# Patient Record
Sex: Female | Born: 1940 | Race: White | Hispanic: No | Marital: Married | Smoking: Never smoker
Health system: Southern US, Community
[De-identification: ages and names within clinical notes are randomized; demographics above are authoritative.]

## PROBLEM LIST (undated history)

## (undated) DIAGNOSIS — E785 Hyperlipidemia, unspecified: Secondary | ICD-10-CM

## (undated) DIAGNOSIS — I10 Essential (primary) hypertension: Secondary | ICD-10-CM

## (undated) DIAGNOSIS — E039 Hypothyroidism, unspecified: Secondary | ICD-10-CM

## (undated) DIAGNOSIS — K219 Gastro-esophageal reflux disease without esophagitis: Secondary | ICD-10-CM

## (undated) DIAGNOSIS — M519 Unspecified thoracic, thoracolumbar and lumbosacral intervertebral disc disorder: Secondary | ICD-10-CM

## (undated) DIAGNOSIS — C801 Malignant (primary) neoplasm, unspecified: Secondary | ICD-10-CM

## (undated) DIAGNOSIS — I341 Nonrheumatic mitral (valve) prolapse: Secondary | ICD-10-CM

## (undated) DIAGNOSIS — Z923 Personal history of irradiation: Secondary | ICD-10-CM

## (undated) HISTORY — PX: ABDOMINAL HYSTERECTOMY: SHX81

## (undated) HISTORY — PX: THYROID SURGERY: SHX805

## (undated) HISTORY — PX: STOMACH SURGERY: SHX791

---
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
## 2004-02-04 ENCOUNTER — Ambulatory Visit: Payer: Self-pay | Admitting: Unknown Physician Specialty

## 2004-02-04 ENCOUNTER — Ambulatory Visit: Payer: Self-pay | Admitting: Internal Medicine

## 2004-05-16 ENCOUNTER — Ambulatory Visit: Payer: Self-pay | Admitting: Unknown Physician Specialty

## 2004-09-06 ENCOUNTER — Ambulatory Visit: Payer: Self-pay | Admitting: Internal Medicine

## 2004-09-26 ENCOUNTER — Ambulatory Visit: Payer: Self-pay | Admitting: Internal Medicine

## 2005-03-09 ENCOUNTER — Ambulatory Visit: Payer: Self-pay | Admitting: Internal Medicine

## 2005-06-15 ENCOUNTER — Ambulatory Visit: Payer: Self-pay | Admitting: Internal Medicine

## 2005-06-15 IMAGING — NM NUCLEAR MEDICINE HEPATOHBILIARY INCLUDE GB
1 series · 6 of 6 positions shown · non-contrast
Comparison: none

REASON FOR EXAM: RUQ pain
COMMENTS:

[Series 0: cck hepato · 3.9mm · 3.90mm/px · 6 of 60 frames shown]
[frame 6/60]
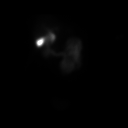
[frame 16/60]
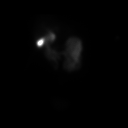
[frame 26/60]
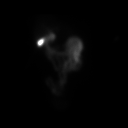
[frame 36/60]
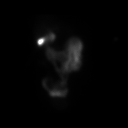
[frame 46/60]
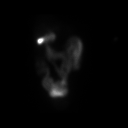
[frame 56/60]
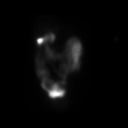

[6 of 6 positions shown; findings below may reference images not displayed]

PROCEDURE:     NM  - NM HEPATO WITH GB EJECT FRACTION  - [DATE] [DATE]

RESULT:     Following injection of 8.47 mCi of Technetium 99m Choletec,
there is noted prompt visualization of tracer activity in the liver at 3
minutes. At 85 minutes, tracer activity is visualized in the gallbladder,
common duct and proximal small bowel.

the normal range.
IMPRESSION: 1.     Normal Hepatobiliary Scan.
2.     Normal gallbladder ejection fraction.

## 2006-03-05 ENCOUNTER — Ambulatory Visit: Payer: Self-pay | Admitting: Internal Medicine

## 2006-04-16 ENCOUNTER — Other Ambulatory Visit: Payer: Self-pay

## 2006-04-16 ENCOUNTER — Inpatient Hospital Stay: Payer: Self-pay | Admitting: Internal Medicine

## 2006-04-24 ENCOUNTER — Ambulatory Visit: Payer: Self-pay | Admitting: Internal Medicine

## 2007-01-30 ENCOUNTER — Ambulatory Visit: Payer: Self-pay | Admitting: Internal Medicine

## 2007-03-22 ENCOUNTER — Ambulatory Visit: Payer: Self-pay | Admitting: Internal Medicine

## 2008-01-22 ENCOUNTER — Emergency Department: Payer: Self-pay | Admitting: Internal Medicine

## 2008-03-23 ENCOUNTER — Ambulatory Visit: Payer: Self-pay | Admitting: Internal Medicine

## 2008-07-01 ENCOUNTER — Ambulatory Visit: Payer: Self-pay | Admitting: Unknown Physician Specialty

## 2008-11-05 ENCOUNTER — Ambulatory Visit: Payer: Self-pay | Admitting: Internal Medicine

## 2009-01-11 ENCOUNTER — Ambulatory Visit: Payer: Self-pay | Admitting: Unknown Physician Specialty

## 2009-01-15 ENCOUNTER — Ambulatory Visit: Payer: Self-pay | Admitting: Unknown Physician Specialty

## 2009-05-27 ENCOUNTER — Ambulatory Visit: Payer: Self-pay | Admitting: Internal Medicine

## 2009-06-08 ENCOUNTER — Ambulatory Visit: Payer: Self-pay | Admitting: Pain Medicine

## 2009-07-08 ENCOUNTER — Ambulatory Visit: Payer: Self-pay | Admitting: Pain Medicine

## 2009-07-15 ENCOUNTER — Ambulatory Visit: Payer: Self-pay | Admitting: Pain Medicine

## 2009-07-20 ENCOUNTER — Emergency Department: Payer: Self-pay | Admitting: Emergency Medicine

## 2009-07-29 ENCOUNTER — Ambulatory Visit: Payer: Self-pay | Admitting: Pain Medicine

## 2010-04-22 ENCOUNTER — Ambulatory Visit: Payer: Self-pay | Admitting: Unknown Physician Specialty

## 2010-04-26 LAB — PATHOLOGY REPORT

## 2010-05-30 ENCOUNTER — Ambulatory Visit: Payer: Self-pay | Admitting: Internal Medicine

## 2010-09-27 ENCOUNTER — Ambulatory Visit: Payer: Self-pay | Admitting: Neurosurgery

## 2011-02-27 ENCOUNTER — Ambulatory Visit: Payer: Self-pay | Admitting: Neurosurgery

## 2011-06-19 ENCOUNTER — Ambulatory Visit: Payer: Self-pay | Admitting: Internal Medicine

## 2012-06-19 ENCOUNTER — Ambulatory Visit: Payer: Self-pay | Admitting: Internal Medicine

## 2013-03-25 ENCOUNTER — Ambulatory Visit: Payer: Self-pay | Admitting: Ophthalmology

## 2013-04-02 ENCOUNTER — Ambulatory Visit: Payer: Self-pay | Admitting: Ophthalmology

## 2013-06-02 ENCOUNTER — Ambulatory Visit: Payer: Self-pay | Admitting: Unknown Physician Specialty

## 2013-06-04 LAB — PATHOLOGY REPORT

## 2013-06-13 DIAGNOSIS — E785 Hyperlipidemia, unspecified: Secondary | ICD-10-CM | POA: Insufficient documentation

## 2013-06-13 DIAGNOSIS — E782 Mixed hyperlipidemia: Secondary | ICD-10-CM | POA: Insufficient documentation

## 2013-06-13 DIAGNOSIS — I10 Essential (primary) hypertension: Secondary | ICD-10-CM | POA: Insufficient documentation

## 2013-06-20 ENCOUNTER — Ambulatory Visit: Payer: Self-pay | Admitting: Internal Medicine

## 2013-10-01 ENCOUNTER — Ambulatory Visit: Payer: Self-pay | Admitting: Ophthalmology

## 2013-10-14 ENCOUNTER — Ambulatory Visit: Payer: Self-pay | Admitting: Ophthalmology

## 2013-11-18 ENCOUNTER — Ambulatory Visit: Payer: Self-pay | Admitting: Ophthalmology

## 2014-06-16 ENCOUNTER — Ambulatory Visit
Admit: 2014-06-16 | Disposition: A | Discharge status: Home / Self Care | Payer: Self-pay | Attending: Surgery | Admitting: Surgery

## 2014-06-16 LAB — COMPREHENSIVE METABOLIC PANEL
ALBUMIN: 3.9 g/dL
ANION GAP: 3 — AB (ref 7–16)
Alkaline Phosphatase: 56 U/L
BUN: 18 mg/dL
Bilirubin,Total: 0.6 mg/dL
CO2: 31 mmol/L
Calcium, Total: 9.5 mg/dL
Chloride: 103 mmol/L
Creatinine: 0.82 mg/dL
EGFR (Non-African Amer.): >60
GLUCOSE: 98 mg/dL
Potassium: 4.4 mmol/L
SGOT(AST): 21 U/L
SGPT (ALT): 11 U/L — ABNORMAL LOW
SODIUM: 137 mmol/L
Total Protein: 6.4 g/dL — ABNORMAL LOW

## 2014-06-16 LAB — CBC WITH DIFFERENTIAL/PLATELET
Basophil #: 0 10*3/uL (ref 0.0–0.1)
Basophil %: 0.6 %
Eosinophil #: 0.1 10*3/uL (ref 0.0–0.7)
Eosinophil %: 1.5 %
HCT: 40.2 % (ref 35.0–47.0)
HGB: 13.4 g/dL (ref 12.0–16.0)
LYMPHS PCT: 32 %
Lymphocyte #: 1.6 10*3/uL (ref 1.0–3.6)
MCH: 31.2 pg (ref 26.0–34.0)
MCHC: 33.4 g/dL (ref 32.0–36.0)
MCV: 93 fL (ref 80–100)
MONOS PCT: 9 %
Monocyte #: 0.4 x10 3/mm (ref 0.2–0.9)
Neutrophil #: 2.8 10*3/uL (ref 1.4–6.5)
Neutrophil %: 56.9 %
PLATELETS: 241 10*3/uL (ref 150–440)
RBC: 4.3 10*6/uL (ref 3.80–5.20)
RDW: 13.2 % (ref 11.5–14.5)
WBC: 5 10*3/uL (ref 3.6–11.0)

## 2014-06-19 ENCOUNTER — Ambulatory Visit
Admit: 2014-06-19 | Disposition: A | Discharge status: Home / Self Care | Payer: Self-pay | Attending: Surgery | Admitting: Surgery

## 2014-06-20 NOTE — Op Note (Signed)
PATIENT NAME:  Chelsea Peters, Chelsea Peters MR#:  [id number] DATE OF BIRTH:  [birth year]  ACCOUNT NUMBER: [id number]  DATE OF PROCEDURE:  04/02/2013  PROCEDURE PERFORMED:  1. Pars plana vitrectomy of the left eye.  2. Internal limiting membrane peel of the left eye.   PREOPERATIVE DIAGNOSES:  1. Epiretinal membrane.  2. Retinal edema.   POSTOPERATIVE DIAGNOSES:  1. Epiretinal membrane.  2. Retinal edema.   ESTIMATED BLOOD LOSS: Less than 1 mL.  PRIMARY SURGEON: Teresa Pelton. Tacy Chavis, MD  ANESTHESIA: Retrobulbar block of the left eye with monitored anesthesia care.   COMPLICATIONS: None.   INDICATIONS FOR PROCEDURE: The patient presented to my office with slowly decreasing vision in the left eye and visually significant metamorphopsia interfering with driving and reading. Risks, benefits and alternatives of the above procedure were discussed, and the patient wished to proceed.   DETAILS: After informed consent was obtained, the patient was brought into the operative suite at [facility]. The patient was placed in supine position, was given a small dose of Alfenta, and a retrobulbar block was performed on the left eye by the primary surgeon without any complications. The left eye was prepped and draped in sterile manner. After lid speculum was inserted, a 25-gauge trocar was placed inferotemporally through displaced conjunctiva 4 mm beyond the limbus in an oblique fashion. Infusion cannula was turned on and inserted through the trocar and secured into position with Steri-Strips. Two more trocars were placed in a similar fashion superotemporally and superonasally. The vitreous cutter and light pipe were introduced in the eye, and a core vitrectomy was performed. Vitreous face was confirmed as elevated and removed. The peripheral vitreous was trimmed for 360 degrees with care taken to avoid hitting the crystalline lens. Indocyanine green was injected onto the posterior pole and removed  within 30 seconds. The epiretinal membrane followed by internal limiting membrane peel were performed for 360 degrees around the fovea without any complication. A scleral depressed exam was performed for 360 degrees, and no signs of any breaks, tears or retinal detachment could be identified for 360 degrees. A partial air-fluid exchange was performed, and the trocars were removed. The wounds were noted to be airtight, and the pressure in the eye was confirmed to be approximately 15 mmHg. Dexamethasone 5 mg was given into the inferior fornix, and the lid speculum was removed. The eye was cleaned, and TobraDex was placed in the eye. A patch and shield were placed over the eye, and the patient was taken to postanesthesia care with instructions to remain head up.    ____________________________ Teresa Pelton. Starling Manns, MD mfa:lb D: 04/02/2013 09:11:24 ET T: 04/02/2013 09:23:33 ET JOB#: [id number]  cc: Teresa Pelton. Starling Manns, MD, <Dictator> Coralee Rud MD ELECTRONICALLY SIGNED 04/02/2013 11:13

## 2014-06-20 NOTE — Op Note (Signed)
PATIENT NAME:  Chelsea Peters, Chelsea Peters MR#:  [id number] DATE OF BIRTH:  1940-09-21  DATE OF PROCEDURE:  11/18/2013  PREOPERATIVE DIAGNOSIS: Visually significant cataract of the right eye.   POSTOPERATIVE DIAGNOSIS: Visually significant cataract of the right eye.   OPERATIVE PROCEDURE: Cataract extraction by phacoemulsification with implant of intraocular lens to right eye.   SURGEON: Birder Robson, MD  ANESTHESIA:  1. Managed anesthesia care.  2. Topical tetracaine drops followed by 2% Xylocaine jelly applied in the preoperative holding area.   COMPLICATIONS: None.   TECHNIQUE:  Stop and chop.   DESCRIPTION OF PROCEDURE: The patient was examined and consented in the preoperative holding area where the aforementioned topical anesthesia was applied to the right eye and then brought back to the operating room where the right eye was prepped and draped in the usual sterile ophthalmic fashion and a lid speculum was placed. A paracentesis was created with the side port blade and the anterior chamber was filled with viscoelastic. A near clear corneal incision was performed with the steel keratome. A continuous curvilinear capsulorrhexis was performed with a cystotome followed by the capsulorrhexis forceps. Hydrodissection and hydrodelineation were carried out with BSS on a blunt cannula. The lens was removed in a stop-and-chop technique and the remaining cortical material was removed with the irrigation-aspiration handpiece. The capsular bag was inflated with viscoelastic and the Tecnis ZCB00 2.5-diopter lens, serial number [id number] was placed in the capsular bag without complication. The remaining viscoelastic was removed from the eye with the irrigation-aspiration handpiece. The wounds were hydrated. The anterior chamber was flushed with Miostat and the eye was inflated to physiologic pressure; 0.1 mL of cefuroxime concentration 10 mg/mL was placed in the anterior chamber. The wounds were found to be  water tight. The eye was dressed with Vigamox. The patient was given protective glasses to wear throughout the day and a shield with which to sleep tonight. The patient was also given drops with which to begin a drop regimen today and will follow up with me in one day.    ____________________________ Livingston Diones. Jaquasha Carnevale, MD wlp:ST D: 11/18/2013 21:07:53 ET T: 11/18/2013 21:45:16 ET JOB#: [id number]  cc: Allon Costlow L. Chauncey Bruno, MD, <Dictator> Livingston Diones Radford Pease MD ELECTRONICALLY SIGNED 11/19/2013 10:43

## 2014-06-20 NOTE — Op Note (Signed)
PATIENT NAME:  Chelsea Peters, BARRE MR#:  [id number] DATE OF BIRTH:  Aug 23, 1940  DATE OF PROCEDURE:  10/14/2013  PREOPERATIVE DIAGNOSIS: Visually significant cataract of the left eye.   POSTOPERATIVE DIAGNOSIS: Visually significant cataract of the left eye.   OPERATIVE PROCEDURE: Cataract extraction by phacoemulsification with implant of intraocular lens to left eye.   SURGEON: Birder Robson, MD.   ANESTHESIA:  1. Managed anesthesia care.  2. Topical tetracaine drops followed by 2% Xylocaine jelly applied in the preoperative holding area.   COMPLICATIONS: None.   TECHNIQUE:  Stop and chop.  DESCRIPTION OF PROCEDURE: The patient was examined and consented in the preoperative holding area where the aforementioned topical anesthesia was applied to the left eye and then brought back to the Operating Room where the left eye was prepped and draped in the usual sterile ophthalmic fashion and a lid speculum was placed. A paracentesis was created with the side port blade and the anterior chamber was filled with viscoelastic. A near clear corneal incision was performed with the steel keratome. A continuous curvilinear capsulorrhexis was performed with a cystotome followed by the capsulorrhexis forceps. Hydrodissection and hydrodelineation were carried out with BSS on a blunt cannula. The lens was removed in a stop-and-chop technique and the remaining cortical material was removed with the irrigation-aspiration handpiece. The capsular bag was inflated with viscoelastic and the Tecnis ZCB00 21.5-diopter lens, serial number [id number] was placed in the capsular bag without complication. The remaining viscoelastic was removed from the eye with the irrigation-aspiration handpiece. The wounds were hydrated. The anterior chamber was flushed with Miostat and the eye was inflated to physiologic pressure. 0.1 mL of cefuroxime concentration 10 mg/mL was placed in the anterior chamber. The wounds were found to be water  tight. The eye was dressed with Vigamox. The patient was given protective glasses to wear throughout the day and a shield with which to sleep tonight. The patient was also given drops with which to begin a drop regimen today and will follow-up with me in one day.    ____________________________ Livingston Diones. Andree Golphin, MD wlp:TT D: 10/14/2013 20:33:52 ET T: 10/14/2013 21:47:21 ET JOB#: [id number]  cc: Taeya Theall L. Jamiesha Victoria, MD, <Dictator> Livingston Diones Emi Lymon MD ELECTRONICALLY SIGNED 10/15/2013 8:51

## 2014-06-22 ENCOUNTER — Ambulatory Visit: Admit: 2014-06-22 | Disposition: A | Discharge status: Home / Self Care | Payer: Self-pay | Admitting: Internal Medicine

## 2014-06-22 LAB — SURGICAL PATHOLOGY

## 2014-06-23 ENCOUNTER — Other Ambulatory Visit: Payer: Self-pay | Admitting: Internal Medicine

## 2014-06-23 DIAGNOSIS — Z1231 Encounter for screening mammogram for malignant neoplasm of breast: Secondary | ICD-10-CM

## 2014-06-28 NOTE — Op Note (Signed)
PATIENT NAME:  Chelsea Peters, Chelsea Peters MR#:  [id number] DATE OF BIRTH:  March 04, 1940  DATE OF PROCEDURE:  06/19/2014  PREOPERATIVE DIAGNOSIS: Left thyroid nodule.   POSTOPERATIVE DIAGNOSIS:  Left thyroid nodule.  PROCEDURE: Left thyroid lobectomy.   SURGEON:  Loreli Dollar, M.D.   ANESTHESIA: General.   INDICATIONS: This 74 year old female has history of left thyroid nodule gradually increasing in size with moderate discomfort. Ultrasound demonstrated a 3.4 x 2 x 2.7 cm nodule. Fine needle aspiration demonstrated hyperplastic adenomatoid nodule. Surgery was recommended for definitive treatment.   DESCRIPTION OF PROCEDURE: The patient was placed on the operating table in the supine position under general endotracheal anesthesia.  The rolled towel was placed behind the shoulder blades to extend the neck as the head was placed on a doughnut ring. The neck and upper chest wall were prepared with ChloraPrep and draped in a sterile manner.   A transversely oriented 6 cm collar incision was made, carried down through a thin layer of subcutaneous tissues, and through the platysma.  Electrocautery was used for hemostasis. There were 3 veins which were suture ligated with 4-0 chromic and divided.  Two of the veins were approximately 5 mm in dimension and the other vein was proximally 7 mm in dimension. The sternocleidomastoid muscle was separated from the strap muscles on each side and the strap muscles were divided with Harmonic scalpel. Next, a smooth plane of dissection was found adjacent to the thyroid capsule on the left side and blunt and sharp dissection was carried out.  The left lower lobe was mobilized with traction and further blunt and sharp dissection. The superior pole was dissected.  The inferior thyroidal artery was adjacent to the capsule.  It was divided with a Harmonic scalpel. One parathyroid gland was identified somewhat superior to the inferior thyroidal artery.  The superior pole was doubly  ligated with 3-0 Vicryl and divided with the Harmonic scalpel. Further dissection was carried out as the thyroid was further mobilized.  It did not appear that the inferior thyroidal nerve was attached to the gland.  The gland was further mobilized and the isthmus was divided with Harmonic scalpel. The nodule was further palpated and noted to be smooth and completely resected and the upper pole was tagged with a stitch for the pathologist's orientation and was submitted in formalin for routine pathology. The wound was inspected and it appeared that hemostasis was intact. Next, the strap muscles were approximated with interrupted 4-0 Vicryl figure-of-eight sutures. The platysma was closed with interrupted 5-0 Vicryl sutures and the skin was infiltrated with 0.5% Sensorcaine with epinephrine.  Then the skin was closed with running 5-0 Monocryl subcuticular suture and LiquiBand.   The patient appeared to tolerate the procedure satisfactorily and was then prepared for transfer to the recovery room.   ____________________________ Lenna Sciara. Rochel Brome, MD jws:sp D: 06/19/2014 10:20:11 ET T: 06/19/2014 10:56:39 ET JOB#: [id number]  cc: Loreli Dollar, MD, <Dictator> Loreli Dollar MD ELECTRONICALLY SIGNED 06/24/2014 17:32

## 2014-07-02 ENCOUNTER — Ambulatory Visit
Admission: RE | Admit: 2014-07-02 | Discharge: 2014-07-02 | Disposition: A | Discharge status: Home / Self Care | Payer: Medicare Other | Source: Ambulatory Visit | Attending: Internal Medicine | Admitting: Internal Medicine

## 2014-07-02 DIAGNOSIS — Z1231 Encounter for screening mammogram for malignant neoplasm of breast: Secondary | ICD-10-CM | POA: Insufficient documentation

## 2015-05-19 ENCOUNTER — Emergency Department: Payer: Medicare Other

## 2015-05-19 ENCOUNTER — Emergency Department
Admission: EM | Admit: 2015-05-19 | Discharge: 2015-05-19 | Disposition: A | Discharge status: Home / Self Care | Payer: Medicare Other | Attending: Emergency Medicine | Admitting: Emergency Medicine

## 2015-05-19 DIAGNOSIS — R531 Weakness: Secondary | ICD-10-CM | POA: Insufficient documentation

## 2015-05-19 DIAGNOSIS — M79602 Pain in left arm: Secondary | ICD-10-CM | POA: Diagnosis not present

## 2015-05-19 DIAGNOSIS — R55 Syncope and collapse: Secondary | ICD-10-CM | POA: Diagnosis present

## 2015-05-19 DIAGNOSIS — R42 Dizziness and giddiness: Secondary | ICD-10-CM | POA: Diagnosis not present

## 2015-05-19 DIAGNOSIS — R079 Chest pain, unspecified: Secondary | ICD-10-CM | POA: Diagnosis not present

## 2015-05-19 DIAGNOSIS — R61 Generalized hyperhidrosis: Secondary | ICD-10-CM | POA: Insufficient documentation

## 2015-05-19 LAB — CBC WITH DIFFERENTIAL/PLATELET
Basophils Absolute: 0 10*3/uL (ref 0–0.1)
Basophils Relative: 1 %
Eosinophils Absolute: 0.2 10*3/uL (ref 0–0.7)
Eosinophils Relative: 3 %
HEMATOCRIT: 42.5 % (ref 35.0–47.0)
HEMOGLOBIN: 14.4 g/dL (ref 12.0–16.0)
LYMPHS ABS: 2.5 10*3/uL (ref 1.0–3.6)
LYMPHS PCT: 41 %
MCH: 31.8 pg (ref 26.0–34.0)
MCHC: 34 g/dL (ref 32.0–36.0)
MCV: 93.7 fL (ref 80.0–100.0)
MONO ABS: 0.5 10*3/uL (ref 0.2–0.9)
MONOS PCT: 9 %
NEUTROS ABS: 2.9 10*3/uL (ref 1.4–6.5)
NEUTROS PCT: 46 %
Platelets: 264 10*3/uL (ref 150–440)
RBC: 4.53 MIL/uL (ref 3.80–5.20)
RDW: 13.5 % (ref 11.5–14.5)
WBC: 6.1 10*3/uL (ref 3.6–11.0)

## 2015-05-19 LAB — URINALYSIS COMPLETE WITH MICROSCOPIC (ARMC ONLY)
Bilirubin Urine: NEGATIVE
Glucose, UA: NEGATIVE mg/dL
Hgb urine dipstick: NEGATIVE
Ketones, ur: NEGATIVE mg/dL
LEUKOCYTES UA: NEGATIVE
Nitrite: NEGATIVE
PH: 8 (ref 5.0–8.0)
PROTEIN: NEGATIVE mg/dL
RBC / HPF: NONE SEEN RBC/hpf (ref 0–5)
Specific Gravity, Urine: 1.002 — ABNORMAL LOW (ref 1.005–1.030)
WBC, UA: NONE SEEN WBC/hpf (ref 0–5)

## 2015-05-19 LAB — COMPREHENSIVE METABOLIC PANEL
ALBUMIN: 4.1 g/dL (ref 3.5–5.0)
ALK PHOS: 61 U/L (ref 38–126)
ALT: 17 U/L (ref 14–54)
ANION GAP: 6 (ref 5–15)
AST: 31 U/L (ref 15–41)
BILIRUBIN TOTAL: 0.6 mg/dL (ref 0.3–1.2)
BUN: 16 mg/dL (ref 6–20)
CO2: 27 mmol/L (ref 22–32)
CREATININE: 0.81 mg/dL (ref 0.44–1.00)
Calcium: 9.1 mg/dL (ref 8.9–10.3)
Chloride: 104 mmol/L (ref 101–111)
GFR calc Af Amer: >60 mL/min (ref >60)
GLUCOSE: 109 mg/dL — AB (ref 65–99)
POTASSIUM: 3.1 mmol/L — AB (ref 3.5–5.1)
Sodium: 137 mmol/L (ref 135–145)
Total Protein: 7.1 g/dL (ref 6.5–8.1)

## 2015-05-19 LAB — TROPONIN I: Troponin I: <0.03 ng/mL (ref <0.031)

## 2015-05-19 MED ORDER — SODIUM CHLORIDE 0.9 % IV SOLN
1000.0000 mL | Freq: Once | INTRAVENOUS | Status: AC
  Administered 2015-05-19: 1000 mL via INTRAVENOUS

## 2015-05-19 MED ORDER — LEVOFLOXACIN IN D5W 750 MG/150ML IV SOLN
INTRAVENOUS | Status: AC
  Filled 2015-05-19: qty 150

## 2015-05-19 MED ORDER — LORAZEPAM 2 MG/ML IJ SOLN
0.5000 mg | Freq: Once | INTRAMUSCULAR | Status: AC
  Administered 2015-05-19: 0.5 mg via INTRAVENOUS
  Filled 2015-05-19: qty 1

## 2015-05-19 NOTE — ED Provider Notes (Signed)
[facility] Emergency Department Provider Note     Time seen: ----------------------------------------- 9:37 AM on 05/19/2015 -----------------------------------------    I have reviewed the triage vital signs and the nursing notes.   HISTORY  Chief Complaint Arm Pain; Chest Pain; and Near Syncope    HPI Chelsea Peters is a 75 y.o. female who presents to ER for near syncopal feeling that started last night. Patient also is having some pain in her left arm, this morning around 7:30 she was feeling more lightheaded and felt diaphoretic. She does report that some pressure in the center of her chest with radiation to left arm, denies any recent illness. She does report recent stressors at home. She denies history of symptoms in the past.   No past medical history on file.  There are no active problems to display for this patient.   No past surgical history on file.  Allergies Review of patient's allergies indicates not on file.  Social History Social History  Substance Use Topics  . Smoking status: Not on file  . Smokeless tobacco: Not on file  . Alcohol Use: Not on file    Review of Systems Constitutional: Negative for fever. Eyes: Negative for visual changes. ENT: Negative for sore throat. Cardiovascular: Positive for chest pain Respiratory: Negative for shortness of breath. Gastrointestinal: Negative for abdominal pain, vomiting and diarrhea. Genitourinary: Negative for dysuria. Musculoskeletal: Negative for back pain. Skin: Positive for sweats Neurological: Negative for headaches, positive for weakness and lightheadedness  10-point ROS otherwise negative.  ____________________________________________   PHYSICAL EXAM:  VITAL SIGNS: ED Triage Vitals  Enc Vitals Group     BP --      Pulse --      Resp --      Temp --      Temp src --      SpO2 --      Weight --      Height --      Head Cir --      Peak Flow --      Pain  Score --      Pain Loc --      Pain Edu? --      Excl. in [place]? --    Constitutional: Alert and oriented. Well appearing and in no distress.Mildly anxious Eyes: Conjunctivae are normal. PERRL. Normal extraocular movements. ENT   Head: Normocephalic and atraumatic.   Nose: No congestion/rhinnorhea.   Mouth/Throat: Mucous membranes are moist.   Neck: No stridor. Cardiovascular: Normal rate, regular rhythm. Normal and symmetric distal pulses are present in all extremities. No murmurs, rubs, or gallops. Respiratory: Normal respiratory effort without tachypnea nor retractions. Breath sounds are clear and equal bilaterally. No wheezes/rales/rhonchi. Gastrointestinal: Soft and nontender. No distention. No abdominal bruits.  Musculoskeletal: Nontender with normal range of motion in all extremities. No joint effusions.  No lower extremity tenderness nor edema. Neurologic:  Normal speech and language. No gross focal neurologic deficits are appreciated.  Skin:  Skin is warm, dry and intact. No rash noted. Psychiatric: Mood and affect are normal.  ____________________________________________  EKG: Interpreted by me. Normal sinus rhythm with a rate of 79 bpm, normal PR interval, normal QRS, normal QT interval. Nonspecific ST changes  ____________________________________________  ED COURSE:  Pertinent labs & imaging results that were available during my care of the patient were reviewed by me and considered in my medical decision making (see chart for details). Patient's no distress, will evaluate for weakness and near-syncope. ____________________________________________  LABS (pertinent positives/negatives)  Labs Reviewed  COMPREHENSIVE METABOLIC PANEL - Abnormal; Notable for the following:    Potassium 3.1 (*)    Glucose, Bld 109 (*)    All other components within normal limits  URINALYSIS COMPLETEWITH MICROSCOPIC ([facility] ONLY) - Abnormal; Notable for the following:    Color,  Urine STRAW (*)    APPearance CLEAR (*)    Specific Gravity, Urine 1.002 (*)    Bacteria, UA RARE (*)    Squamous Epithelial / LPF 0-5 (*)    All other components within normal limits  CBC WITH DIFFERENTIAL/PLATELET  TROPONIN I  TROPONIN I    RADIOLOGY  Chest x-ray Unremarkable ____________________________________________  FINAL ASSESSMENT AND PLAN  Near-syncope  Plan: Patient with labs and imaging as dictated above. Patient with nonspecific symptoms that are likely stress related. Serial troponins have been negative, I advise follow-up with cardiology for stress testing as an outpatient.   Earleen Newport, MD   Earleen Newport, MD 05/19/15 [phone]

## 2015-05-19 NOTE — ED Notes (Signed)
Dr. Williams in room to reassess patient.  Will continue to monitor.   

## 2015-05-19 NOTE — ED Notes (Signed)
Pt reports she began last night having some pain in her left arm, this am around 0730 pt noticed she was having some light headedness and felt diaphoretic. Pt reports there is pressure in the center of her chest with radiation into left arm.

## 2015-05-19 NOTE — ED Notes (Signed)
Orthostatic Vital Signs  Lying:  HR-68, BP-139/62 Sitting:  HR-74, BP-145/73 Standing:  HR-72, BP-136/78

## 2015-05-19 NOTE — Discharge Instructions (Signed)

## 2015-05-20 ENCOUNTER — Telehealth: Payer: Self-pay | Admitting: Cardiology

## 2015-05-20 NOTE — Telephone Encounter (Signed)
Lmov for patient to call back to make ED fu from 05/19/15  Was seen in ED for Chest Pain

## 2015-05-26 ENCOUNTER — Other Ambulatory Visit: Payer: Self-pay | Admitting: Internal Medicine

## 2015-05-26 DIAGNOSIS — Z1231 Encounter for screening mammogram for malignant neoplasm of breast: Secondary | ICD-10-CM

## 2015-05-28 ENCOUNTER — Other Ambulatory Visit: Payer: Self-pay | Admitting: Cardiology

## 2015-06-02 ENCOUNTER — Ambulatory Visit
Admission: RE | Admit: 2015-06-02 | Discharge: 2015-06-02 | Disposition: A | Discharge status: Home / Self Care | Payer: Medicare Other | Source: Ambulatory Visit | Attending: Cardiology | Admitting: Cardiology

## 2015-06-02 ENCOUNTER — Encounter
Admission: RE | Disposition: A | Discharge status: Home / Self Care | Payer: Self-pay | Source: Ambulatory Visit | Attending: Cardiology

## 2015-06-02 DIAGNOSIS — Z803 Family history of malignant neoplasm of breast: Secondary | ICD-10-CM | POA: Insufficient documentation

## 2015-06-02 DIAGNOSIS — K219 Gastro-esophageal reflux disease without esophagitis: Secondary | ICD-10-CM | POA: Diagnosis not present

## 2015-06-02 DIAGNOSIS — E890 Postprocedural hypothyroidism: Secondary | ICD-10-CM | POA: Diagnosis not present

## 2015-06-02 DIAGNOSIS — I10 Essential (primary) hypertension: Secondary | ICD-10-CM | POA: Insufficient documentation

## 2015-06-02 DIAGNOSIS — Z9049 Acquired absence of other specified parts of digestive tract: Secondary | ICD-10-CM | POA: Insufficient documentation

## 2015-06-02 DIAGNOSIS — Z888 Allergy status to other drugs, medicaments and biological substances status: Secondary | ICD-10-CM | POA: Diagnosis not present

## 2015-06-02 DIAGNOSIS — Z90710 Acquired absence of both cervix and uterus: Secondary | ICD-10-CM | POA: Insufficient documentation

## 2015-06-02 DIAGNOSIS — E041 Nontoxic single thyroid nodule: Secondary | ICD-10-CM | POA: Insufficient documentation

## 2015-06-02 DIAGNOSIS — R931 Abnormal findings on diagnostic imaging of heart and coronary circulation: Secondary | ICD-10-CM | POA: Diagnosis present

## 2015-06-02 DIAGNOSIS — Z823 Family history of stroke: Secondary | ICD-10-CM | POA: Insufficient documentation

## 2015-06-02 DIAGNOSIS — Z79899 Other long term (current) drug therapy: Secondary | ICD-10-CM | POA: Diagnosis not present

## 2015-06-02 DIAGNOSIS — E785 Hyperlipidemia, unspecified: Secondary | ICD-10-CM | POA: Diagnosis not present

## 2015-06-02 DIAGNOSIS — Z8249 Family history of ischemic heart disease and other diseases of the circulatory system: Secondary | ICD-10-CM | POA: Insufficient documentation

## 2015-06-02 DIAGNOSIS — Z880 Allergy status to penicillin: Secondary | ICD-10-CM | POA: Diagnosis not present

## 2015-06-02 DIAGNOSIS — R079 Chest pain, unspecified: Secondary | ICD-10-CM | POA: Diagnosis present

## 2015-06-02 HISTORY — PX: CARDIAC CATHETERIZATION: SHX172

## 2015-06-02 HISTORY — DX: Gastro-esophageal reflux disease without esophagitis: K21.9

## 2015-06-02 HISTORY — DX: Essential (primary) hypertension: I10

## 2015-06-02 HISTORY — DX: Unspecified thoracic, thoracolumbar and lumbosacral intervertebral disc disorder: M51.9

## 2015-06-02 HISTORY — DX: Hypothyroidism, unspecified: E03.9

## 2015-06-02 HISTORY — DX: Hyperlipidemia, unspecified: E78.5

## 2015-06-02 SURGERY — LEFT HEART CATH AND CORONARY ANGIOGRAPHY
Anesthesia: Moderate Sedation

## 2015-06-02 MED ORDER — SODIUM CHLORIDE 0.9% FLUSH: 3.0000 mL | INTRAVENOUS | Status: DC | PRN

## 2015-06-02 MED ORDER — SODIUM CHLORIDE 0.9 % IV SOLN: 250.0000 mL | INTRAVENOUS | Status: DC | PRN

## 2015-06-02 MED ORDER — FENTANYL CITRATE (PF) 100 MCG/2ML IJ SOLN
INTRAMUSCULAR | Status: AC
  Filled 2015-06-02: qty 2

## 2015-06-02 MED ORDER — SODIUM CHLORIDE 0.9 % WEIGHT BASED INFUSION
3.0000 mL/kg/h | INTRAVENOUS | Status: DC
Start: 2015-06-03 — End: 2015-06-02

## 2015-06-02 MED ORDER — IOPAMIDOL (ISOVUE-300) INJECTION 61%
INTRAVENOUS | Status: DC | PRN
  Administered 2015-06-02: 90 mL via INTRA_ARTERIAL

## 2015-06-02 MED ORDER — FENTANYL CITRATE (PF) 100 MCG/2ML IJ SOLN
INTRAMUSCULAR | Status: DC | PRN
  Administered 2015-06-02: 25 ug via INTRAVENOUS

## 2015-06-02 MED ORDER — SODIUM CHLORIDE 0.9 % WEIGHT BASED INFUSION: 1.0000 mL/kg/h | INTRAVENOUS | Status: DC

## 2015-06-02 MED ORDER — SODIUM CHLORIDE 0.9% FLUSH: 3.0000 mL | Freq: Two times a day (BID) | INTRAVENOUS | Status: DC

## 2015-06-02 MED ORDER — MIDAZOLAM HCL 2 MG/2ML IJ SOLN
INTRAMUSCULAR | Status: AC
  Filled 2015-06-02: qty 2

## 2015-06-02 MED ORDER — ASPIRIN 81 MG PO CHEW: 81.0000 mg | CHEWABLE_TABLET | ORAL | Status: DC

## 2015-06-02 MED ORDER — HEPARIN (PORCINE) IN NACL 2-0.9 UNIT/ML-% IJ SOLN
INTRAMUSCULAR | Status: AC
  Filled 2015-06-02: qty 500

## 2015-06-02 MED ORDER — MIDAZOLAM HCL 2 MG/2ML IJ SOLN
INTRAMUSCULAR | Status: DC | PRN
  Administered 2015-06-02: 1 mg via INTRAVENOUS

## 2015-06-02 MED ORDER — SODIUM CHLORIDE 0.9 % IV SOLN
INTRAVENOUS | Status: DC
  Administered 2015-06-02: 07:00:00 via INTRAVENOUS

## 2015-06-02 SURGICAL SUPPLY — 8 items
CATH INFINITI 5FR ANG PIGTAIL (CATHETERS) ×3 IMPLANT
CATH INFINITI 5FR JL4 (CATHETERS) ×3 IMPLANT
CATH INFINITI JR4 5F (CATHETERS) ×3 IMPLANT
KIT MANI 3VAL PERCEP (MISCELLANEOUS) ×3 IMPLANT
NEEDLE PERC 18GX7CM (NEEDLE) ×3 IMPLANT
PACK CARDIAC CATH (CUSTOM PROCEDURE TRAY) ×3 IMPLANT
SHEATH AVANTI 5FR X 11CM (SHEATH) ×3 IMPLANT
WIRE EMERALD 3MM-J .035X150CM (WIRE) ×3 IMPLANT

## 2015-06-02 NOTE — H&P (Signed)
Chief Complaint: Chief Complaint  Patient presents with  . Establish Care  chest pressure no pain has been on going to a while--abnormal stress echo done by mark miller  . Fatigue  . Dizziness  Date of Service: 05/28/2015 Date of Birth: 04/04/1940 PCP: Rusty Aus, MD  History of Present Illness: Chelsea Peters is a 75 y.o.female patient who is referred for evaluation after being noted to have an abnormal stress echo. Patient had an episode of chest pressure with some diaphoresis. She was evaluated in the emergency room with a negative workup. She has had intermittent chest tightness. She underwent an exercise stress echo read as showing inferior ischemia. Risk factors include hypertension and hyperlipidemia. She is currently on amlodipine for blood pressure control. Her LDL is 149. She is currently not on a statin.  Past Medical and Surgical History  Past Medical History Past Medical History  Diagnosis Date  . GERD (gastroesophageal reflux disease)  . Hyperlipidemia, unspecified  . Hypertension  . Lumbar disc disease 07/30/2013  . Thyroid nodule   Past Surgical History She has a past surgical history that includes Cholecystectomy; laminectomy posterior lumbar 1/2 segments; Hysterectomy; and hemithyroidectomy (Left, 05/2014).   Medications and Allergies  Current Medications  Current Outpatient Prescriptions  Medication Sig Dispense Refill  . ALPRAZolam (XANAX) 0.25 MG tablet Take 1 tablet (0.25 mg total) by mouth once daily as needed. 90 tablet 1  . amLODIPine (NORVASC) 5 MG tablet TAKE 1 TABLET BY MOUTH EVERY DAY 90 tablet 3  . biotin 1 mg tablet Take 2,000 mcg by mouth once daily.  . cholecalciferol (VITAMIN D3) 2,000 unit tablet Take 2,000 Units by mouth once daily.  Marland Kitchen estradiol (ESTRACE) 1 MG tablet TAKE 1/2 TABLET BY MOUTH ONCE DAILY 90 tablet 3  . FLUoxetine (PROZAC) 20 MG tablet Take 20 mg by mouth once daily.  Marland Kitchen levothyroxine (SYNTHROID, LEVOTHROID) 25 MCG tablet TAKE 1/2  TABLET BY MOUTH EVERY DAY 90 tablet 3  . omeprazole (PRILOSEC) 20 MG DR capsule Take 1 capsule (20 mg total) by mouth once daily. 90 capsule 3  . ranitidine (ZANTAC) 150 MG tablet Take 1 tablet (150 mg total) by mouth once daily as needed. 90 tablet 3  . traMADol (ULTRAM) 50 mg tablet Take 1 tablet (50 mg total) by mouth 2 (two) times daily as needed. 180 tablet 1   No current facility-administered medications for this visit.   Allergies: Ambien [zolpidem]; Paxil [paroxetine hcl]; and Penicillins  Social and Family History  Social History reports that she has never smoked. She has never used smokeless tobacco. She reports that she drinks alcohol. She reports that she does not use illicit drugs.  Family History Family History  Problem Relation Age of Onset  . Stroke Mother  . Breast cancer Mother  . No Known Problems Father  . Hypertension Paternal Aunt  . Hyperlipidemia Paternal Aunt   Review of Systems  Review of Systems  Constitutional: Negative for chills, diaphoresis, fever, malaise/fatigue and weight loss.  HENT: Negative for congestion, ear discharge, hearing loss and tinnitus.  Eyes: Negative for blurred vision.  Respiratory: Negative for cough, hemoptysis, sputum production, shortness of breath and wheezing.  Cardiovascular: Positive for chest pain. Negative for palpitations, orthopnea, claudication, leg swelling and PND.  Gastrointestinal: Negative for abdominal pain, blood in stool, constipation, diarrhea, heartburn, melena, nausea and vomiting.  Genitourinary: Negative for dysuria, frequency, hematuria and urgency.  Musculoskeletal: Negative for back pain, falls, joint pain and myalgias.  Skin: Negative for itching  and rash.  Neurological: Negative for dizziness, tingling, focal weakness, loss of consciousness, weakness and headaches.  Endo/Heme/Allergies: Negative for polydipsia. Does not bruise/bleed easily.  Psychiatric/Behavioral: Negative for depression, memory loss  and substance abuse. The patient is not nervous/anxious.   Physical Examination   Vitals: Visit Vitals  . BP 118/72  . Pulse 64  . Resp 12  . Ht 156.2 cm (5' 1.5")  . Wt 51.3 kg (113 lb)  . BMI 21.01 kg/m2   Ht:156.2 cm (5' 1.5") Wt:51.3 kg (113 lb) [id number] surface area is 1.49 meters squared. Body mass index is 21.01 kg/(m^2).  Wt Readings from Last 3 Encounters:  05/28/15 51.3 kg (113 lb)  05/24/15 50.8 kg (112 lb)  03/12/15 49.9 kg (110 lb)   BP Readings from Last 3 Encounters:  05/28/15 118/72  05/24/15 122/78  03/12/15 138/78   General appearance appears in no acute distress  Head Mouth and Eye exam Normocephalic, without obvious abnormality, atraumatic Dentition is good Eyes appear anicteric   Neck exam Thyroid: normal  Nodes: no obvious adenopathy  LUNGS Breath Sounds: Normal Percussion: Normal  CARDIOVASCULAR JVP CV wave: no HJR: no Elevation at 90 degrees: None Carotid Pulse: normal pulsation bilaterally Bruit: None Apex: apical impulse normal  Auscultation Rhythm: normal sinus rhythm S1: normal S2: normal Clicks: no Rub: no Murmurs: no murmurs  Gallop: None ABDOMEN Liver enlargement: no Pulsatile aorta: no Ascites: no Bruits: no  EXTREMITIES Clubbing: no Edema: trace to 1+ bilateral pedal edema Pulses: peripheral pulses symmetrical Femoral Bruits: no Amputation: no SKIN Rash: no Cyanosis: no Embolic phemonenon: no Bruising: no NEURO Alert and Oriented to person, place and time: yes Non focal: yes  PSYCH: Pt appears to have normal affect  LABS REVIEWED Last 3 CBC results: Lab Results  Component Value Date  WBC 5.2 03/05/2015  WBC 5.6 02/25/2014  WBC 7.3 10/03/2010   Lab Results  Component Value Date  HGB 14.5 03/05/2015  HGB 14.5 02/25/2014  HGB 14.6 10/03/2010   Lab Results  Component Value Date  HCT 42.0 03/05/2015  HCT 42.2 02/25/2014  HCT 41.8 10/03/2010   Lab Results  Component Value Date  PLT  278 03/05/2015  PLT 273 02/25/2014  PLT 219 10/03/2010   Lab Results  Component Value Date  CREATININE 0.9 03/05/2015  BUN 18 03/05/2015  NA 140 03/05/2015  K 4.0 03/05/2015  CL 102 03/05/2015  CO2 31.1 03/05/2015   No results found for: HGBA1C  Lab Results  Component Value Date  HDL 90.7 (H) 03/05/2015  HDL 76.5 02/25/2014   Lab Results  Component Value Date  LDLCALC 149 (H) 03/05/2015  LDLCALC 142 (H) 02/25/2014   Lab Results  Component Value Date  TRIG 91 03/05/2015  TRIG 148 02/25/2014   Lab Results  Component Value Date  ALT 9 03/05/2015  AST 19 03/05/2015  ALKPHOS 52 03/05/2015   Lab Results  Component Value Date  TSH 2.631 03/05/2015   Diagnostic Studies Reviewed:  EKG EKG demonstrated normal sinus rhythm, nonspecific ST and T waves changes.  Assessment and Plan   75 y.o. female with  ICD-10-CM ICD-9-CM  1. Essential hypertension-blood pressure is controlled with amlodipine. Will continue with this and DASH diet I10 [id number] Basic Metabolic Panel (BMP)  CBC w/auto Differential (5 Part)  2. Mixed hyperlipidemia-LDL is 149. Goal would be at least less than 130. May need a statin. Strict adherence to low-sodium low-fat diet recommended E78.2 272.2  3. Anxiety, unspecified-currently was started on Prozac F41.9 300.00  4. chest pain-stress echo read as showing inferior ischemia. Patient has risk factors for heart disease and exertional chest pain. This has progressed to class III to IV. Risk and benefits of left heart cath were explained to patient. (Proceed left heart cath on April 5.   Return in about 1 week (around 06/04/2015).  These notes generated with voice recognition software. I apologize for typographical errors.  Sydnee Levans, MD

## 2015-06-02 NOTE — Discharge Instructions (Addendum)
Angiogram, Care After °Refer to this sheet in the next few weeks. These instructions provide you with information about caring for yourself after your procedure. Your health care provider may also give you more specific instructions. Your treatment has been planned according to current medical practices, but problems sometimes occur. Call your health care provider if you have any problems or questions after your procedure. °WHAT TO EXPECT AFTER THE PROCEDURE °After your procedure, it is typical to have the following: °· Bruising at the catheter insertion site that usually fades within 1-2 weeks. °· Blood collecting in the tissue (hematoma) that may be painful to the touch. It should usually decrease in size and tenderness within 1-2 weeks. °HOME CARE INSTRUCTIONS °· Take medicines only as directed by your health care provider. °· You may shower 24-48 hours after the procedure or as directed by your health care provider. Remove the bandage (dressing) and gently wash the site with plain soap and water. Pat the area dry with a clean towel. Do not rub the site, because this may cause bleeding. °· Do not take baths, swim, or use a hot tub until your health care provider approves. °· Check your insertion site every day for redness, swelling, or drainage. °· Do not apply powder or lotion to the site. °· Do not lift over 10 lb (4.5 kg) for 5 days after your procedure or as directed by your health care provider. °· Ask your health care provider when it is okay to: °¨ Return to work or school. °¨ Resume usual physical activities or sports. °¨ Resume sexual activity. °· Do not drive home if you are discharged the same day as the procedure. Have someone else drive you. °· You may drive 24 hours after the procedure unless otherwise instructed by your health care provider. °· Do not operate machinery or power tools for 24 hours after the procedure or as directed by your health care provider. °· If your procedure was done as an  outpatient procedure, which means that you went home the same day as your procedure, a responsible adult should be with you for the first 24 hours after you arrive home. °· Keep all follow-up visits as directed by your health care provider. This is important. °SEEK MEDICAL CARE IF: °· You have a fever. °· You have chills. °· You have increased bleeding from the catheter insertion site. Hold pressure on the site. °SEEK IMMEDIATE MEDICAL CARE IF: °· You have unusual pain at the catheter insertion site. °· You have redness, warmth, or swelling at the catheter insertion site. °· You have drainage (other than a small amount of blood on the dressing) from the catheter insertion site. °· The catheter insertion site is bleeding, and the bleeding does not stop after 30 minutes of holding steady pressure on the site. °· The area near or just beyond the catheter insertion site becomes pale, cool, tingly, or numb. °  °This information is not intended to replace advice given to you by your health care provider. Make sure you discuss any questions you have with your health care provider. °  °Document Released: 09/01/2004 Document Revised: 03/06/2014 Document Reviewed: 07/17/2012 °Elsevier Interactive Patient Education ©2016 [employer] ° °

## 2015-07-05 ENCOUNTER — Ambulatory Visit
Admission: RE | Admit: 2015-07-05 | Discharge: 2015-07-05 | Disposition: A | Discharge status: Home / Self Care | Payer: Medicare Other | Source: Ambulatory Visit | Attending: Internal Medicine | Admitting: Internal Medicine

## 2015-07-05 ENCOUNTER — Other Ambulatory Visit: Payer: Self-pay | Admitting: Internal Medicine

## 2015-07-05 DIAGNOSIS — Z1231 Encounter for screening mammogram for malignant neoplasm of breast: Secondary | ICD-10-CM

## 2016-03-21 DIAGNOSIS — I10 Essential (primary) hypertension: Secondary | ICD-10-CM | POA: Insufficient documentation

## 2016-05-10 ENCOUNTER — Other Ambulatory Visit: Payer: Self-pay | Admitting: Internal Medicine

## 2016-05-10 DIAGNOSIS — Z1231 Encounter for screening mammogram for malignant neoplasm of breast: Secondary | ICD-10-CM

## 2016-05-26 ENCOUNTER — Ambulatory Visit: Payer: Medicare Other | Admitting: Podiatry

## 2016-05-26 ENCOUNTER — Ambulatory Visit: Payer: Self-pay | Admitting: Podiatry

## 2016-07-05 ENCOUNTER — Ambulatory Visit: Payer: 59

## 2016-07-13 ENCOUNTER — Ambulatory Visit
Admission: RE | Admit: 2016-07-13 | Discharge: 2016-07-13 | Disposition: A | Discharge status: Home / Self Care | Payer: Medicare Other | Source: Ambulatory Visit | Attending: Internal Medicine | Admitting: Internal Medicine

## 2016-07-13 DIAGNOSIS — Z1231 Encounter for screening mammogram for malignant neoplasm of breast: Secondary | ICD-10-CM | POA: Diagnosis not present

## 2016-07-14 ENCOUNTER — Other Ambulatory Visit: Payer: Self-pay | Admitting: Internal Medicine

## 2016-07-14 DIAGNOSIS — N6489 Other specified disorders of breast: Secondary | ICD-10-CM

## 2016-07-14 DIAGNOSIS — R928 Other abnormal and inconclusive findings on diagnostic imaging of breast: Secondary | ICD-10-CM

## 2016-07-26 ENCOUNTER — Ambulatory Visit
Admission: RE | Admit: 2016-07-26 | Discharge: 2016-07-26 | Disposition: A | Discharge status: Home / Self Care | Payer: Medicare Other | Source: Ambulatory Visit | Attending: Internal Medicine | Admitting: Internal Medicine

## 2016-07-26 DIAGNOSIS — N6489 Other specified disorders of breast: Secondary | ICD-10-CM

## 2016-07-26 DIAGNOSIS — R928 Other abnormal and inconclusive findings on diagnostic imaging of breast: Secondary | ICD-10-CM

## 2017-04-24 DIAGNOSIS — Z0000 Encounter for general adult medical examination without abnormal findings: Secondary | ICD-10-CM | POA: Insufficient documentation

## 2017-09-11 ENCOUNTER — Other Ambulatory Visit: Payer: Self-pay | Admitting: Internal Medicine

## 2017-09-11 DIAGNOSIS — Z1231 Encounter for screening mammogram for malignant neoplasm of breast: Secondary | ICD-10-CM

## 2017-09-28 ENCOUNTER — Ambulatory Visit
Admission: RE | Admit: 2017-09-28 | Discharge: 2017-09-28 | Disposition: A | Discharge status: Home / Self Care | Payer: Medicare Other | Source: Ambulatory Visit | Attending: Internal Medicine | Admitting: Internal Medicine

## 2017-09-28 DIAGNOSIS — Z1231 Encounter for screening mammogram for malignant neoplasm of breast: Secondary | ICD-10-CM | POA: Insufficient documentation

## 2018-03-27 DIAGNOSIS — F3341 Major depressive disorder, recurrent, in partial remission: Secondary | ICD-10-CM | POA: Insufficient documentation

## 2018-04-09 ENCOUNTER — Other Ambulatory Visit: Payer: Self-pay | Admitting: Internal Medicine

## 2018-04-09 DIAGNOSIS — G8929 Other chronic pain: Secondary | ICD-10-CM

## 2018-04-09 DIAGNOSIS — M5441 Lumbago with sciatica, right side: Principal | ICD-10-CM

## 2018-04-12 ENCOUNTER — Ambulatory Visit
Admission: RE | Admit: 2018-04-12 | Discharge: 2018-04-12 | Disposition: A | Discharge status: Home / Self Care | Payer: Medicare Other | Source: Ambulatory Visit | Attending: Internal Medicine | Admitting: Internal Medicine

## 2018-04-12 ENCOUNTER — Encounter: Payer: Self-pay | Admitting: Radiology

## 2018-04-12 DIAGNOSIS — G8929 Other chronic pain: Secondary | ICD-10-CM | POA: Diagnosis present

## 2018-04-12 DIAGNOSIS — M5441 Lumbago with sciatica, right side: Secondary | ICD-10-CM | POA: Insufficient documentation

## 2018-09-28 DIAGNOSIS — U071 COVID-19: Secondary | ICD-10-CM

## 2018-09-28 DIAGNOSIS — J189 Pneumonia, unspecified organism: Secondary | ICD-10-CM

## 2018-09-28 HISTORY — DX: COVID-19: U07.1

## 2018-09-28 HISTORY — DX: Pneumonia, unspecified organism: J18.9

## 2018-10-05 ENCOUNTER — Emergency Department: Payer: Medicare Other

## 2018-10-05 ENCOUNTER — Emergency Department
Admission: EM | Admit: 2018-10-05 | Discharge: 2018-10-05 | Disposition: A | Discharge status: Home / Self Care | Payer: Medicare Other | Attending: Emergency Medicine | Admitting: Emergency Medicine

## 2018-10-05 ENCOUNTER — Other Ambulatory Visit: Payer: Self-pay

## 2018-10-05 ENCOUNTER — Encounter: Payer: Self-pay | Admitting: Emergency Medicine

## 2018-10-05 DIAGNOSIS — U071 COVID-19: Secondary | ICD-10-CM | POA: Diagnosis not present

## 2018-10-05 DIAGNOSIS — Z79899 Other long term (current) drug therapy: Secondary | ICD-10-CM | POA: Insufficient documentation

## 2018-10-05 DIAGNOSIS — E039 Hypothyroidism, unspecified: Secondary | ICD-10-CM | POA: Insufficient documentation

## 2018-10-05 DIAGNOSIS — R531 Weakness: Secondary | ICD-10-CM | POA: Insufficient documentation

## 2018-10-05 DIAGNOSIS — I10 Essential (primary) hypertension: Secondary | ICD-10-CM | POA: Insufficient documentation

## 2018-10-05 DIAGNOSIS — J181 Lobar pneumonia, unspecified organism: Secondary | ICD-10-CM | POA: Diagnosis not present

## 2018-10-05 DIAGNOSIS — R05 Cough: Secondary | ICD-10-CM | POA: Diagnosis present

## 2018-10-05 DIAGNOSIS — J189 Pneumonia, unspecified organism: Secondary | ICD-10-CM

## 2018-10-05 LAB — LACTIC ACID, PLASMA
Lactic Acid, Venous: 2.6 mmol/L (ref 0.5–1.9)
Lactic Acid, Venous: 1.9 mmol/L (ref 0.5–1.9)

## 2018-10-05 LAB — BASIC METABOLIC PANEL
Anion gap: 9 (ref 5–15)
BUN: 25 mg/dL — ABNORMAL HIGH (ref 8–23)
CO2: 27 mmol/L (ref 22–32)
Calcium: 9 mg/dL (ref 8.9–10.3)
Chloride: 100 mmol/L (ref 98–111)
Creatinine, Ser: 0.79 mg/dL (ref 0.44–1.00)
GFR calc Af Amer: >60 mL/min (ref >60)
GFR calc non Af Amer: >60 mL/min (ref >60)
Glucose, Bld: 88 mg/dL (ref 70–99)
Potassium: 3.8 mmol/L (ref 3.5–5.1)
Sodium: 136 mmol/L (ref 135–145)

## 2018-10-05 LAB — CBC
HCT: 42 % (ref 36.0–46.0)
Hemoglobin: 14.4 g/dL (ref 12.0–15.0)
MCH: 31.6 pg (ref 26.0–34.0)
MCHC: 34.3 g/dL (ref 30.0–36.0)
MCV: 92.1 fL (ref 80.0–100.0)
Platelets: 323 10*3/uL (ref 150–400)
RBC: 4.56 MIL/uL (ref 3.87–5.11)
RDW: 12.6 % (ref 11.5–15.5)
WBC: 16.7 10*3/uL — ABNORMAL HIGH (ref 4.0–10.5)
nRBC: 0 % (ref 0.0–0.2)

## 2018-10-05 LAB — FIBRIN DERIVATIVES D-DIMER (ARMC ONLY): Fibrin derivatives D-dimer (ARMC): 692.54 ng/mL (FEU) — ABNORMAL HIGH (ref 0.00–499.00)

## 2018-10-05 LAB — TROPONIN I (HIGH SENSITIVITY)
Troponin I (High Sensitivity): 12 ng/L (ref <18)
Troponin I (High Sensitivity): 18 ng/L — ABNORMAL HIGH (ref <18)

## 2018-10-05 MED ORDER — SODIUM CHLORIDE 0.9 % IV SOLN
2.0000 g | INTRAVENOUS | Status: DC
  Administered 2018-10-05: 07:00:00 2 g via INTRAVENOUS
  Filled 2018-10-05: qty 20

## 2018-10-05 MED ORDER — SODIUM CHLORIDE 0.9 % IV SOLN
500.0000 mg | INTRAVENOUS | Status: DC
  Administered 2018-10-05: 500 mg via INTRAVENOUS
  Filled 2018-10-05: qty 500

## 2018-10-05 MED ORDER — DOXYCYCLINE HYCLATE 50 MG PO CAPS: 100.0000 mg | ORAL_CAPSULE | Freq: Two times a day (BID) | ORAL | 0 refills | Status: DC

## 2018-10-05 MED ORDER — ONDANSETRON HCL 4 MG/2ML IJ SOLN
4.0000 mg | Freq: Once | INTRAMUSCULAR | Status: AC
  Administered 2018-10-05: 06:00:00 4 mg via INTRAVENOUS

## 2018-10-05 MED ORDER — ONDANSETRON HCL 4 MG PO TABS: 4.0000 mg | ORAL_TABLET | Freq: Every day | ORAL | 0 refills | Status: DC | PRN

## 2018-10-05 MED ORDER — DOXYCYCLINE HYCLATE 50 MG PO CAPS: 100.0000 mg | ORAL_CAPSULE | Freq: Two times a day (BID) | ORAL | 0 refills | Status: AC

## 2018-10-05 MED ORDER — ONDANSETRON HCL 4 MG/2ML IJ SOLN
INTRAMUSCULAR | Status: AC
  Filled 2018-10-05: qty 2

## 2018-10-05 MED ORDER — ACETAMINOPHEN 500 MG PO TABS
1000.0000 mg | ORAL_TABLET | Freq: Once | ORAL | Status: AC
  Administered 2018-10-05: 06:00:00 1000 mg via ORAL
  Filled 2018-10-05: qty 2

## 2018-10-05 MED ORDER — SODIUM CHLORIDE 0.9 % IV BOLUS
500.0000 mL | Freq: Once | INTRAVENOUS | Status: AC
  Administered 2018-10-05: 06:00:00 500 mL via INTRAVENOUS

## 2018-10-05 NOTE — ED Provider Notes (Signed)
-----------------------------------------   8:32 AM on 10/05/2018 -----------------------------------------  I took over care on this patient from Dr. Quentin Cornwall.  The patient has a diagnosis of COVID-19 and presented for fatigue, cough, and chills.  The patient had a generally reassuring work-up and expressed a strong desire to be discharged home.  At the time I took over care, the plan was to wait for repeat lactic acid and troponin and proceed with discharge if there were no significant changes.  Repeat lactic acid is negative.  The repeat troponin is just at the cutoff of the reference range and there is a six-point increase from the initial troponin.  This is just at the cut off of our current algorithm for evaluation with high-sensitivity troponin.  On reassessment, the patient is not having any chest pain and states she has not had chest pain during her ED stay.  She has no EKG changes.  From a clinical perspective, there is no evidence of ACS or significant myocardial strain or any indication for further cardiac work-up at this time.  I offered observation admission, but she continues to express a strong preference to go home which I think is quite reasonable.  I counseled her on the results of the work-up.  I gave her very thorough return precautions as well as isolation instructions related to COVID-19 and she expressed understanding and agreement.  She is stable for discharge at this time.     Arta Silence, MD 10/05/18 [phone]

## 2018-10-05 NOTE — ED Provider Notes (Addendum)
[facility] Emergency Department Provider Note    First MD Initiated Contact with Patient 10/05/18 [phone]     (approximate)  I have reviewed the triage vital signs and the nursing notes.   HISTORY  Chief Complaint Cough and Weakness    HPI Chelsea Peters is a 78 y.o. female the below listed past medical history and recent diagnosis of COVID-19 not currently on any antibiotics but was prescribed dexamethasone presents the ER for generalized fatigue weakness persistent cough.  Was having shaking and chills last night which is why she called EMS for evaluation.  They do have a home pulse oximeter and have been checking it and her oxygen level has been normal.  Does not wear home oxygen.  Denies any history of COPD of heart failure.  Has had some decreased oral intake over the past several days.    Past Medical History:  Diagnosis Date   GERD (gastroesophageal reflux disease)    Hyperlipidemia    Hypertension    Hypothyroidism    Lumbar disc disease    Family History  Problem Relation Age of Onset   Breast cancer Mother    Breast cancer Maternal Aunt    Breast cancer Maternal Aunt    Past Surgical History:  Procedure Laterality Date   CARDIAC CATHETERIZATION N/A 06/02/2015   Procedure: Left Heart Cath and Coronary Angiography;  Surgeon: Teodoro Spray, MD;  Location: [facility];  Service: Cardiovascular;  Laterality: N/A;   Patient Active Problem List   Diagnosis Date Noted   Chest pain syndrome 06/02/2015   BP (high blood pressure) 06/13/2013   HLD (hyperlipidemia) 06/13/2013      Prior to Admission medications   Medication Sig Start Date End Date Taking? Authorizing Provider  ALPRAZolam Duanne Moron) 0.25 MG tablet Take 0.25 mg by mouth daily as needed for anxiety.    [provider]  amLODipine (NORVASC) 5 MG tablet Take 5 mg by mouth daily.    [provider]  Biotin 1000 MCG tablet Take 2,000 mcg by mouth  daily.     [provider]  Cholecalciferol (D 2000) 2000 units TABS Take 2,000 Units by mouth daily.    [provider]  doxycycline (VIBRAMYCIN) 50 MG capsule Take 2 capsules (100 mg total) by mouth 2 (two) times daily for 7 days. 10/05/18 10/12/18  Merlyn Lot, MD  DULoxetine (CYMBALTA) 30 MG capsule Take 30 mg by mouth daily. Reported on 06/02/2015    [provider]  estradiol (ESTRACE) 1 MG tablet Take 0.5 mg by mouth daily.    [provider]  FLUoxetine (PROZAC) 20 MG capsule Take 20 mg by mouth daily.    [provider]  levothyroxine (SYNTHROID, LEVOTHROID) 25 MCG tablet Take 12.5 mcg by mouth daily.    [provider]  omeprazole (PRILOSEC) 20 MG capsule Take 20 mg by mouth daily as needed.    [provider]  ondansetron (ZOFRAN) 4 MG tablet Take 1 tablet (4 mg total) by mouth daily as needed. 10/05/18 10/05/19  Merlyn Lot, MD  traMADol (ULTRAM) 50 MG tablet Take 50 mg by mouth every 12 (twelve) hours as needed.    [provider]    Allergies Ambien [zolpidem], Paxil [paroxetine], and Penicillins    Social History Social History   Tobacco Use   Smoking status: Never Smoker   Smokeless tobacco: Never Used  Substance Use Topics   Alcohol use: Yes    Alcohol/week: 1.0 standard  drinks    Types: 1 Glasses of wine per week   Drug use: No    Review of Systems Patient denies headaches, rhinorrhea, blurry vision, numbness, shortness of breath, chest pain, edema, cough, abdominal pain, nausea, vomiting, diarrhea, dysuria, fevers, rashes or hallucinations unless otherwise stated above in HPI. ____________________________________________   PHYSICAL EXAM:  VITAL SIGNS: Vitals:   10/05/18 0640 10/05/18 0700  BP:  114/62  Pulse:  63  Resp:  19  Temp: 99.6 F (37.6 C)   SpO2:  98%    Constitutional: Alert and oriented.  Eyes: Conjunctivae are normal.  Head: Atraumatic. Nose: No  congestion/rhinnorhea. Mouth/Throat: Mucous membranes are moist.   Neck: No stridor. Painless ROM.  Cardiovascular: Normal rate, regular rhythm. Grossly normal heart sounds.  Good peripheral circulation. Respiratory: Normal respiratory effort.  No retractions. Lungs CTAB. Gastrointestinal: Soft and nontender. No distention. No abdominal bruits. No CVA tenderness. Genitourinary: deferred Musculoskeletal: No lower extremity tenderness nor edema.  No joint effusions. Neurologic:  Normal speech and language. No gross focal neurologic deficits are appreciated. No facial droop Skin:  Skin is warm, dry and intact. No rash noted. Psychiatric: Mood and affect are normal. Speech and behavior are normal.  ____________________________________________   LABS (all labs ordered are listed, but only abnormal results are displayed)  Results for orders placed or performed during the hospital encounter of 10/05/18 (from the past 24 hour(s))  Basic metabolic panel     Status: Abnormal   Collection Time: 10/05/18  4:42 AM  Result Value Ref Range   Sodium 136 135 - 145 mmol/L   Potassium 3.8 3.5 - 5.1 mmol/L   Chloride 100 98 - 111 mmol/L   CO2 27 22 - 32 mmol/L   Glucose, Bld 88 70 - 99 mg/dL   BUN 25 (H) 8 - 23 mg/dL   Creatinine, Ser 0.79 0.44 - 1.00 mg/dL   Calcium 9.0 8.9 - 10.3 mg/dL   GFR calc non Af Amer >60 >60 mL/min   GFR calc Af Amer >60 >60 mL/min   Anion gap 9 5 - 15  CBC     Status: Abnormal   Collection Time: 10/05/18  4:42 AM  Result Value Ref Range   WBC 16.7 (H) 4.0 - 10.5 K/uL   RBC 4.56 3.87 - 5.11 MIL/uL   Hemoglobin 14.4 12.0 - 15.0 g/dL   HCT 42.0 36.0 - 46.0 %   MCV 92.1 80.0 - 100.0 fL   MCH 31.6 26.0 - 34.0 pg   MCHC 34.3 30.0 - 36.0 g/dL   RDW 12.6 11.5 - 15.5 %   Platelets 323 150 - 400 K/uL   nRBC 0.0 0.0 - 0.2 %  Blood culture (routine x 2)     Status: None (Preliminary result)   Collection Time: 10/05/18  4:42 AM   Specimen: BLOOD  Result Value Ref Range    Specimen Description BLOOD RIGHT ANTECUBITAL    Special Requests      BOTTLES DRAWN AEROBIC AND ANAEROBIC Blood Culture adequate volume   Culture      NO GROWTH <12 HOURS Performed at [facility], [address] [place], [place] [address]    Report Status PENDING   Blood culture (routine x 2)     Status: None (Preliminary result)   Collection Time: 10/05/18  4:42 AM   Specimen: BLOOD  Result Value Ref Range   Specimen Description BLOOD LEFT ANTECUBITAL    Special Requests      BOTTLES DRAWN AEROBIC  AND ANAEROBIC Blood Culture adequate volume   Culture      NO GROWTH <12 HOURS Performed at [facility], [address] [place], [place] [address]    Report Status PENDING   Lactic acid, plasma     Status: Abnormal   Collection Time: 10/05/18  4:42 AM  Result Value Ref Range   Lactic Acid, Venous 2.6 (HH) 0.5 - 1.9 mmol/L  Troponin I (High Sensitivity)     Status: None   Collection Time: 10/05/18  4:42 AM  Result Value Ref Range   Troponin I (High Sensitivity) 12 <18 ng/L  Fibrin derivatives D-Dimer ([facility] only)     Status: Abnormal   Collection Time: 10/05/18  4:42 AM  Result Value Ref Range   Fibrin derivatives D-dimer ([facility]) 692.54 (H) 0.00 - 499.00 ng/mL (FEU)  Lactic acid, plasma     Status: None   Collection Time: 10/05/18  7:06 AM  Result Value Ref Range   Lactic Acid, Venous 1.9 0.5 - 1.9 mmol/L   ____________________________________________  EKG My review and personal interpretation at Time: 4:30   Indication: sob  Rate: 70  Rhythm: sinus Axis: normal Other: subtle st depression in inferolateral leads that appear consistent with previous ecg 05/19/15.  No stemi ____________________________________________  RADIOLOGY  I personally reviewed all radiographic images ordered to evaluate for the above acute complaints and reviewed radiology reports and findings.  These findings were personally discussed with the patient.  Please see medical record for  radiology report.  ____________________________________________   PROCEDURES  Procedure(s) performed:  Procedures    Critical Care performed: no ____________________________________________   INITIAL IMPRESSION / ASSESSMENT AND PLAN / ED COURSE  Pertinent labs & imaging results that were available during my care of the patient were reviewed by me and considered in my medical decision making (see chart for details).   DDX: covid 73, sepsis, pna, chf, bronchitis, pna  ZOILA DITULLIO is a 78 y.o. who presents to the ED with symptoms as described above.  Patient nontoxic-appearing, no hypoxia but is febrile.  With recent diagnosis will order blood work for the but differential.  The patient will be placed on continuous pulse oximetry and telemetry for monitoring.  Laboratory evaluation will be sent to evaluate for the above complaints.     Clinical Course as of Oct 04 736  Sat Oct 05, 2018  0603 Discussed results of blood work and imaging with patient.  Given infiltrate with productive cough and fever tenably meeting sepsis criteria will start on antibiotics.  Discussed my recommendation for admission to the hospital patient states that she would prefer outpatient management.  She is not hypoxic will observe in the ER for repeat lactate and reassessment.  If repeat lactate clears she is not febrile and not having any hypoxia a trial of outpatient management would not be unreasonable in the setting of known COVID-19.  She has home pulse oximeter to monitor.   [PR]  0656 D-dimer below age-adjusted normal.  Fairly reassuring result in the setting of known COVID.  Remains without any hypoxia.  Temperature improving.  Tolerating oral hydration.   [PR]    Clinical Course User Index [PR] Merlyn Lot, MD   Patient reassessed and feels improved.  Tolerating oral hydration.  Repeat lactate is normal.  Have a low suspicion for ACS.  Has been given antibiotics for community-acquired  pneumonia at this most likely pneumonitis in the setting of COVID.  We discussed symptomatic treatment.  Patient is still  requesting discharge home.  Patient will be signed out to oncoming physician pending results of repeat troponin and observation to ensure that she is not developing any hypoxia but she otherwise appears clinically well at this time. The patient was evaluated in Emergency Department today for the symptoms described in the history of present illness. He/she was evaluated in the context of the global COVID-19 pandemic, which necessitated consideration that the patient might be at risk for infection with the SARS-CoV-2 virus that causes COVID-19. Institutional protocols and algorithms that pertain to the evaluation of patients at risk for COVID-19 are in a state of rapid change based on information released by regulatory bodies including the CDC and federal and state organizations. These policies and algorithms were followed during the patient's care in the ED.  As part of my medical decision making, I reviewed the following data within the [place] notes reviewed and incorporated, Labs reviewed, notes from prior ED visits and [place] Controlled Substance Database   ____________________________________________   FINAL CLINICAL IMPRESSION(S) / ED DIAGNOSES  Final diagnoses:  Community acquired pneumonia of left lower lobe of lung ([place])  COVID-19 virus infection      NEW MEDICATIONS STARTED DURING THIS VISIT:  New Prescriptions   DOXYCYCLINE (VIBRAMYCIN) 50 MG CAPSULE    Take 2 capsules (100 mg total) by mouth 2 (two) times daily for 7 days.   ONDANSETRON (ZOFRAN) 4 MG TABLET    Take 1 tablet (4 mg total) by mouth daily as needed.     Note:  This document was prepared using Dragon voice recognition software and may include unintentional dictation errors.    Merlyn Lot, MD 10/05/18 4103    Merlyn Lot, MD 10/05/18 [phone]

## 2018-10-05 NOTE — ED Notes (Signed)
Pt verbalized understanding of discharge instructions. NAD at this time. 

## 2018-10-05 NOTE — ED Triage Notes (Signed)
Pt arrives via [facility] with known Covid+ as of one week ago. Pt states that at this time she is becoming weaker and pt has strong productive cough.

## 2018-10-05 NOTE — ED Notes (Signed)
MD Quentin Cornwall aware of lactic acid result.

## 2018-10-05 NOTE — ED Notes (Signed)
MD Robinson at bedside 

## 2018-10-10 LAB — CULTURE, BLOOD (ROUTINE X 2)
Culture: NO GROWTH
Culture: NO GROWTH
Special Requests: ADEQUATE
Special Requests: ADEQUATE

## 2018-10-25 DIAGNOSIS — Z86010 Personal history of colonic polyps: Secondary | ICD-10-CM | POA: Insufficient documentation

## 2018-11-07 DIAGNOSIS — Z8616 Personal history of COVID-19: Secondary | ICD-10-CM | POA: Insufficient documentation

## 2018-11-07 DIAGNOSIS — Z8619 Personal history of other infectious and parasitic diseases: Secondary | ICD-10-CM | POA: Insufficient documentation

## 2018-11-08 ENCOUNTER — Other Ambulatory Visit: Payer: Self-pay | Admitting: Internal Medicine

## 2018-11-08 DIAGNOSIS — Z1231 Encounter for screening mammogram for malignant neoplasm of breast: Secondary | ICD-10-CM

## 2018-12-19 ENCOUNTER — Other Ambulatory Visit: Payer: Self-pay

## 2018-12-19 ENCOUNTER — Ambulatory Visit
Admission: RE | Admit: 2018-12-19 | Discharge: 2018-12-19 | Disposition: A | Discharge status: Home / Self Care | Payer: Medicare Other | Source: Ambulatory Visit | Attending: Internal Medicine | Admitting: Internal Medicine

## 2018-12-19 DIAGNOSIS — Z1231 Encounter for screening mammogram for malignant neoplasm of breast: Secondary | ICD-10-CM | POA: Diagnosis not present

## 2018-12-19 DIAGNOSIS — Z20822 Contact with and (suspected) exposure to covid-19: Secondary | ICD-10-CM

## 2018-12-23 ENCOUNTER — Other Ambulatory Visit: Payer: Self-pay | Admitting: Internal Medicine

## 2018-12-23 DIAGNOSIS — R928 Other abnormal and inconclusive findings on diagnostic imaging of breast: Secondary | ICD-10-CM

## 2018-12-23 DIAGNOSIS — R921 Mammographic calcification found on diagnostic imaging of breast: Secondary | ICD-10-CM

## 2018-12-31 ENCOUNTER — Ambulatory Visit
Admission: RE | Admit: 2018-12-31 | Discharge: 2018-12-31 | Disposition: A | Discharge status: Home / Self Care | Payer: Medicare Other | Source: Ambulatory Visit | Attending: Internal Medicine | Admitting: Internal Medicine

## 2018-12-31 DIAGNOSIS — R921 Mammographic calcification found on diagnostic imaging of breast: Secondary | ICD-10-CM | POA: Diagnosis present

## 2018-12-31 DIAGNOSIS — R928 Other abnormal and inconclusive findings on diagnostic imaging of breast: Secondary | ICD-10-CM | POA: Diagnosis not present

## 2019-01-01 ENCOUNTER — Other Ambulatory Visit: Payer: Self-pay | Admitting: Internal Medicine

## 2019-01-01 DIAGNOSIS — R928 Other abnormal and inconclusive findings on diagnostic imaging of breast: Secondary | ICD-10-CM

## 2019-01-01 DIAGNOSIS — R921 Mammographic calcification found on diagnostic imaging of breast: Secondary | ICD-10-CM

## 2019-01-08 ENCOUNTER — Other Ambulatory Visit: Payer: Self-pay

## 2019-01-08 ENCOUNTER — Ambulatory Visit
Admission: RE | Admit: 2019-01-08 | Discharge: 2019-01-08 | Disposition: A | Discharge status: Home / Self Care | Payer: Medicare Other | Source: Ambulatory Visit | Attending: Internal Medicine | Admitting: Internal Medicine

## 2019-01-08 DIAGNOSIS — R921 Mammographic calcification found on diagnostic imaging of breast: Secondary | ICD-10-CM | POA: Diagnosis present

## 2019-01-08 DIAGNOSIS — R928 Other abnormal and inconclusive findings on diagnostic imaging of breast: Secondary | ICD-10-CM

## 2019-01-08 HISTORY — PX: BREAST BIOPSY: SHX20

## 2019-01-09 ENCOUNTER — Other Ambulatory Visit: Payer: Self-pay | Admitting: Oncology

## 2019-01-09 LAB — SURGICAL PATHOLOGY

## 2019-01-10 ENCOUNTER — Encounter: Payer: Self-pay | Admitting: *Deleted

## 2019-01-10 DIAGNOSIS — C50911 Malignant neoplasm of unspecified site of right female breast: Secondary | ICD-10-CM

## 2019-01-10 DIAGNOSIS — D0511 Intraductal carcinoma in situ of right breast: Secondary | ICD-10-CM | POA: Insufficient documentation

## 2019-01-10 NOTE — Progress Notes (Signed)
Called patient to establish navigation services.  She is newly diagnosed with DCIS of the right breast.  She is a patient of Dr. [name] and per preference sheet he would refer to [facility] surgery.  I have scheduled the patient an appointment with dr. Lysle Pearl on 01/14/19 @ 4:00 and with medical oncologist DR. Finnegan on 01/15/19 @ 11:15.

## 2019-01-10 NOTE — Progress Notes (Signed)
[place]  Telephone:(336) [phone] Fax:(336) [phone]  ID: Chelsea Peters OB: 1941/02/04  MR#: [id number]  [id number]#:[id number]  Patient Care Team: Rusty Aus, MD as PCP - General (Internal Medicine)  CHIEF COMPLAINT: DCIS, right breast  INTERVAL HISTORY: Patient is a 78 year old female nodes noted to have an abnormality on routine screening mammogram.  Subsequent ultrasound and biopsy revealed the above-stated DCIS without evidence of invasive component.  She currently feels well and is asymptomatic.  She has no neurologic complaints.  She denies any recent fevers or illnesses.  She has a good appetite and denies weight loss.  She has no chest pain, shortness of breath, cough, or hemoptysis.  She denies any nausea, vomiting, constipation, or diarrhea.  She has no urinary complaints.  Patient feels at her baseline and offers no specific complaints today.  REVIEW OF SYSTEMS:   Review of Systems  Constitutional: Negative.  Negative for fever and malaise/fatigue.  Respiratory: Negative.  Negative for cough, hemoptysis and shortness of breath.   Cardiovascular: Negative.  Negative for chest pain and leg swelling.  Gastrointestinal: Negative.  Negative for abdominal pain.  Genitourinary: Negative.  Negative for dysuria.  Musculoskeletal: Negative.  Negative for back pain.  Skin: Negative.  Negative for rash.  Neurological: Negative.  Negative for dizziness, focal weakness, weakness and headaches.  Psychiatric/Behavioral: Negative.  The patient is not nervous/anxious.     As per HPI. Otherwise, a complete review of systems is negative.  PAST MEDICAL HISTORY: Past Medical History:  Diagnosis Date   GERD (gastroesophageal reflux disease)    Hyperlipidemia    Hypertension    Hypothyroidism    Lumbar disc disease     PAST SURGICAL HISTORY: Past Surgical History:  Procedure Laterality Date   BREAST BIOPSY Right 01/08/2019   Affirm Biopsy- X-Clip- Path  pending   CARDIAC CATHETERIZATION N/A 06/02/2015   Procedure: Left Heart Cath and Coronary Angiography;  Surgeon: Teodoro Spray, MD;  Location: [facility];  Service: Cardiovascular;  Laterality: N/A;    FAMILY HISTORY: Family History  Problem Relation Age of Onset   Breast cancer Mother 35   Breast cancer Maternal Aunt    Breast cancer Maternal Aunt    Stomach cancer Other    Throat cancer Other     ADVANCED DIRECTIVES (Y/N):  N  HEALTH MAINTENANCE: Social History   Tobacco Use   Smoking status: Never Smoker   Smokeless tobacco: Never Used  Substance Use Topics   Alcohol use: Yes    Alcohol/week: 1.0 standard drinks    Types: 1 Glasses of wine per week   Drug use: No     Colonoscopy:  PAP:  Bone density:  Lipid panel:  Allergies  Allergen Reactions   Ambien [Zolpidem] Other (See Comments)    erratic behavior/"made me crazy"   Paxil [Paroxetine] Other (See Comments)    Stomach pains.   Penicillins Other (See Comments)    Blisters in nose Did it involve swelling of the face/tongue/throat, SOB, or low BP? No Did it involve sudden or severe rash/hives, skin peeling, or any reaction on the inside of your mouth or nose? Yes--blisters in nose Did you need to seek medical attention at a hospital or doctor's office? No When did it last happen?More than 10 years ago If all above answers are NO, may proceed with cephalosporin use.     Current Outpatient Medications  Medication Sig Dispense Refill   ALPRAZolam (XANAX) 0.25 MG tablet Take 0.25 mg by  mouth daily as needed for anxiety.     amLODipine (NORVASC) 5 MG tablet Take 5 mg by mouth daily.  3   Cholecalciferol (D 2000) 2000 units TABS Take 2,000 Units by mouth daily.     DULoxetine (CYMBALTA) 30 MG capsule Take 30 mg by mouth daily. Reported on 06/02/2015  0   levothyroxine (SYNTHROID, LEVOTHROID) 25 MCG tablet Take 25 mcg by mouth daily.      omeprazole (PRILOSEC) 20 MG capsule Take  20 mg by mouth daily.   0   traMADol (ULTRAM) 50 MG tablet Take 50 mg by mouth every 12 (twelve) hours as needed (for leg pain.).      acetaminophen (TYLENOL) 500 MG tablet Take 500-1,000 mg by mouth every 6 (six) hours as needed (pain.).     Biotin 1 MG CAPS Take 1 mg by mouth daily.     FLUoxetine (PROZAC) 20 MG capsule Take 20 mg by mouth daily.     Multiple Vitamin (MULTIVITAMIN WITH MINERALS) TABS tablet Take 1 tablet by mouth daily.     ondansetron (ZOFRAN) 4 MG tablet Take 1 tablet (4 mg total) by mouth daily as needed. (Patient not taking: Reported on 01/15/2019) 14 tablet 0   No current facility-administered medications for this visit.     OBJECTIVE: Vitals:   01/15/19 1118 01/15/19 1122  BP:  (!) 145/84  Pulse:  70  Temp: 98 F (36.7 C)      Body mass index is 20.8 kg/m.    ECOG FS:0 - Asymptomatic  General: Well-developed, well-nourished, no acute distress. Eyes: Pink conjunctiva, anicteric sclera. HEENT: Normocephalic, moist mucous membranes, clear oropharnyx. Breast: Exam deferred today. Lungs: Clear to auscultation bilaterally. Heart: Regular rate and rhythm. No rubs, murmurs, or gallops. Abdomen: Soft, nontender, nondistended. No organomegaly noted, normoactive bowel sounds. Musculoskeletal: No edema, cyanosis, or clubbing. Neuro: Alert, answering all questions appropriately. Cranial nerves grossly intact. Skin: No rashes or petechiae noted. Psych: Normal affect. Lymphatics: No cervical, calvicular, axillary or inguinal LAD.   LAB RESULTS:  Lab Results  Component Value Date   NA 136 10/05/2018   K 3.8 10/05/2018   CL 100 10/05/2018   CO2 27 10/05/2018   GLUCOSE 88 10/05/2018   BUN 25 (H) 10/05/2018   CREATININE 0.79 10/05/2018   CALCIUM 9.0 10/05/2018   PROT 7.1 05/19/2015   ALBUMIN 4.1 05/19/2015   AST 31 05/19/2015   ALT 17 05/19/2015   ALKPHOS 61 05/19/2015   BILITOT 0.6 05/19/2015   GFRNONAA >60 10/05/2018   GFRAA >60 10/05/2018    Lab  Results  Component Value Date   WBC 16.7 (H) 10/05/2018   NEUTROABS 2.9 05/19/2015   HGB 14.4 10/05/2018   HCT 42.0 10/05/2018   MCV 92.1 10/05/2018   PLT 323 10/05/2018     STUDIES: Mm Digital Diagnostic Unilat R  Result Date: 12/31/2018 CLINICAL DATA:  Screening recall for right breast calcifications. EXAM: DIGITAL DIAGNOSTIC RIGHT MAMMOGRAM COMPARISON:  Previous exam(s). ACR Breast Density Category b: There are scattered areas of fibroglandular density. FINDINGS: Loosely grouped calcifications are noted in the upper outer right breast spanning 10 x 4 x 5 mm. Several are coarsened heterogeneous, few punctate. There is no associated mass or distortion. There is no linearity or branching. IMPRESSION: Indeterminate right breast calcifications.  Biopsy is recommended. RECOMMENDATION: Stereotactic core needle biopsy of the right breast calcifications. I have discussed the findings and recommendations with the patient. If applicable, a reminder letter will be sent to the patient regarding  the next appointment. BI-RADS CATEGORY  4: Suspicious. Electronically Signed   By: Lajean Manes M.D.   On: 12/31/2018 10:49   Mm 3d Screen Breast Bilateral  Result Date: 12/19/2018 CLINICAL DATA:  Screening. EXAM: DIGITAL SCREENING BILATERAL MAMMOGRAM WITH TOMO AND CAD COMPARISON:  Previous exam(s). ACR Breast Density Category b: There are scattered areas of fibroglandular density. FINDINGS: In the right breast, calcifications warrant further evaluation with magnified views. In the left breast, no findings suspicious for malignancy. Images were processed with CAD. IMPRESSION: Further evaluation is suggested for calcifications in the right breast. RECOMMENDATION: Diagnostic mammogram of the right breast. (Code:[id number]) The patient will be contacted regarding the findings, and additional imaging will be scheduled. BI-RADS CATEGORY  0: Incomplete. Need additional imaging evaluation and/or prior mammograms for  comparison. Electronically Signed   By: Margarette Canada M.D.   On: 12/19/2018 15:02   Mm Clip Placement Right  Result Date: 01/08/2019 CLINICAL DATA:  Patient presents for biopsy of right breast calcifications. EXAM: DIAGNOSTIC RIGHT MAMMOGRAM POST STEREOTACTIC BIOPSY COMPARISON:  Previous exam(s). FINDINGS: Mammographic images were obtained following stereotactic guided biopsy of calcifications in the upper outer right breast. The biopsy marking clip is in expected position at the site of biopsy. IMPRESSION: Appropriate positioning of the X shaped biopsy marking clip at the site of biopsy in the location. Final Assessment: Post Procedure Mammograms for Marker Placement Electronically Signed   By: Audie Pinto M.D.   On: 01/08/2019 09:18   Mm Rt Breast Bx W Loc Dev 1st Lesion Image Bx Spec Stereo Guide  Addendum Date: 01/13/2019   ADDENDUM REPORT: 01/10/2019 14:12 ADDENDUM: PATHOLOGY revealed: A. RIGHT BREAST; CALCIFICATIONS; STEREOTACTIC NEEDLE CORE BIOPSY: - DUCTAL CARCINOMA IN SITU, HIGH GRADE COMEDO TYPE, WITH ASSOCIATED CALCIFICATIONS. Comment: DCIS is present in two of seven tissue blocks with the greatest linear extent measuring 3 mm. ER and PR are deferred to an excision specimen. If necessary, these stains could be performed on block A1. Pathology results are CONCORDANT with imaging findings, per Dr. Audie Pinto. Pathology results were discussed with patient via telephone. The patient reported doing well after the biopsy with no adverse symptoms, only tenderness at the site. Post biopsy care instructions were reviewed and questions were answered. The patient was encouraged to call [facility] for any additional questions or concerns. Recommendation: Surgical referral. Per request, nurse navigators at [facility] have arranged for patient to see Dr Benjamine Sprague on 11/16/ 2020. Addendum by Electa Sniff RN on 01/10/2019. Electronically Signed   By: Audie Pinto  M.D.   On: 01/10/2019 14:12   Result Date: 01/13/2019 CLINICAL DATA:  Patient presents for biopsy of calcifications in the upper outer right breast. EXAM: RIGHT BREAST STEREOTACTIC CORE NEEDLE BIOPSY COMPARISON:  Previous exams. FINDINGS: The patient and I discussed the procedure of stereotactic-guided biopsy including benefits and alternatives. We discussed the high likelihood of a successful procedure. We discussed the risks of the procedure including infection, bleeding, tissue injury, clip migration, and inadequate sampling. Informed written consent was given. The usual time out protocol was performed immediately prior to the procedure. Using sterile technique and 1% Lidocaine as local anesthetic, under stereotactic guidance, a 9 gauge vacuum assisted device was used to perform core needle biopsy of calcifications in the upper outer right breast using a superior approach. Specimen radiograph was performed showing 4 of 10 specimens with calcifications. Specimens with calcifications are identified for pathology. Lesion quadrant: Upper outer quadrant At the conclusion of the procedure,  an X tissue marker clip was deployed into the biopsy cavity. Follow-up 2-view mammogram was performed and dictated separately. IMPRESSION: Stereotactic-guided biopsy of calcifications in the upper outer right breast. No apparent complications. Electronically Signed: By: Audie Pinto M.D. On: 01/08/2019 09:19    ASSESSMENT: DCIS, right breast  PLAN:    1. DCIS, right breast: Patient does not have an invasive component on biopsy, therefore will unlikely require adjuvant chemotherapy.  She has lumpectomy scheduled for February 06, 2019.  Patient will follow up in the cancer center 1 to 2 weeks later to discuss her final pathology results.  She will also have consultation with radiation oncology on that day for adjuvant XRT.  Patient also benefit from tamoxifen for a total of 5 years at the completion of her XRT.  I  spent a total of 60 minutes face-to-face with the patient of which greater than 50% of the visit was spent in counseling and coordination of care as detailed above.   Patient expressed understanding and was in agreement with this plan. She also understands that She can call clinic at any time with any questions, concerns, or complaints.   Cancer Staging Ductal carcinoma in situ (DCIS) of right breast Staging form: Breast, AJCC 8th Edition - Clinical stage from 01/16/2019: Stage 0 (cTis (DCIS), cN0, cM0, ER+, PR+, HER2-) - Signed by Lloyd Huger, MD on 01/16/2019   Lloyd Huger, MD   01/16/2019 6:46 AM

## 2019-01-14 ENCOUNTER — Other Ambulatory Visit: Payer: Self-pay

## 2019-01-15 ENCOUNTER — Inpatient Hospital Stay: Payer: Medicare Other | Attending: Oncology | Admitting: Oncology

## 2019-01-15 ENCOUNTER — Other Ambulatory Visit: Payer: Self-pay | Admitting: Surgery

## 2019-01-15 ENCOUNTER — Encounter: Payer: Self-pay | Admitting: Oncology

## 2019-01-15 ENCOUNTER — Ambulatory Visit: Payer: Self-pay | Admitting: Surgery

## 2019-01-15 ENCOUNTER — Other Ambulatory Visit: Payer: Self-pay

## 2019-01-15 DIAGNOSIS — D0511 Intraductal carcinoma in situ of right breast: Secondary | ICD-10-CM

## 2019-01-15 DIAGNOSIS — K219 Gastro-esophageal reflux disease without esophagitis: Secondary | ICD-10-CM | POA: Insufficient documentation

## 2019-01-15 DIAGNOSIS — E039 Hypothyroidism, unspecified: Secondary | ICD-10-CM | POA: Insufficient documentation

## 2019-01-15 DIAGNOSIS — Z803 Family history of malignant neoplasm of breast: Secondary | ICD-10-CM | POA: Insufficient documentation

## 2019-01-15 DIAGNOSIS — Z79899 Other long term (current) drug therapy: Secondary | ICD-10-CM

## 2019-01-15 DIAGNOSIS — I10 Essential (primary) hypertension: Secondary | ICD-10-CM

## 2019-01-15 DIAGNOSIS — E785 Hyperlipidemia, unspecified: Secondary | ICD-10-CM | POA: Insufficient documentation

## 2019-01-15 NOTE — H&P (View-Only) (Signed)
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 female who was referred by [facility] for evaluation of above. Change was noted on last screening mammogram. Patient does not routinely do self breast exams.  Patient has not noted a change on breast exam. Age of menarche was 60. Last menstrual period was 1983. Patient is G2P2.  Patient denies nipple discharge. Patient denies previous breast biopsy. Patient denies a personal history of breast cancer.   Past Medical History:  has a past medical history of GERD (gastroesophageal reflux disease), adenomatous colonic polyps (10/25/2018), Hyperlipidemia, Hypertension, Lumbar disc disease (07/30/2013), Other dysphagia (10/25/2018), and Thyroid nodule.  Past Surgical History:  has a past surgical history that includes Cholecystectomy; laminectomy posterior lumbar 1/2 segments; Hysterectomy; hemithyroidectomy (Left, 05/2014); Colonoscopy (04/28/2004); Colonoscopy (07/01/2008); and Colonoscopy (06/02/2013).  Family History: family history includes Breast cancer in her mother; High blood pressure (Hypertension) in her paternal aunt; Hyperlipidemia (Elevated cholesterol) in her paternal aunt; No Known Problems in her father; Stroke in her mother.  Social History:  reports that she has never smoked. She has never used smokeless tobacco. She reports current alcohol use. She reports that she does not use drugs.  Current Medications: has a current medication list which includes the following prescription(s): alprazolam, amlodipine, biotin, calcium/mag oxide/vitamin d3, cholecalciferol, duloxetine, estradiol, levothyroxine, montelukast, omeprazole, and tramadol.  Allergies:  Allergies as of 01/14/2019 - Reviewed 01/14/2019  Allergen Reaction Noted  . Ambien [zolpidem] Hallucination 06/13/2013  . Paxil [paroxetine hcl] Unknown 06/13/2013  .  Penicillins Other (See Comments)     ROS:  A 15 point review of systems was performed and was negative except as noted in HPI   Objective:     BP 141/80   Pulse 79   Ht 156.2 cm (5' 1.5")   Wt 49.9 kg (110 lb)   BMI 20.45 kg/m   Constitutional :  alert, appears stated age, cooperative and no distress  Lymphatics/Throat:  no asymmetry, masses, or scars  Respiratory:  clear to auscultation bilaterally  Cardiovascular:  regular rate and rhythm  Gastrointestinal: soft, non-tender; bowel sounds normal; no masses,  no organomegaly.   Musculoskeletal: Steady gait and movement  Skin: Cool and moist,   Psychiatric: Normal affect, non-agitated, not confused  Breast:  Chaperone present for exam.  breasts appear normal, except for post biopsy changes on right. no suspicious masses, no skin or nipple changes or axillary nodes.    LABS:  SURGICAL PATHOLOGY SURGICAL PATHOLOGY CASE: [phone] PATIENT: [name] Surgical Pathology Report     Specimen Submitted: A. Breast, right  Clinical History: X-shaped biopsy clip placed following stereotactic biopsy of calcs, UOQ right breast      DIAGNOSIS: A. RIGHT BREAST; CALCIFICATIONS; STEREOTACTIC NEEDLE CORE BIOPSY: - DUCTAL CARCINOMA IN SITU, HIGH GRADE COMEDO TYPE, WITH ASSOCIATED CALCIFICATIONS.  Comment: DCIS is present in two of seven tissue blocks with the greatest linear extent measuring 3 mm. ER and PR are deferred to an excision specimen. If necessary, these stains could be performed on block A1. The findings were communicated to Electa Sniff, RN via [facility] secure chat on 01/09/19.   GROSS DESCRIPTION: A. Labeled: Right breast stereotactic core biopsies-calcs Received: in a formalin-filled CoreTainer transport system Accompanying specimen radiograph: Yes Time / date in fixative: Tissue procedure time 9:16 AM, tissue put in formalin time 9:20 AM 01/08/2019 Cold ischemic time: 4 minutes Total fixation  time: 8  hours Core pieces: Multiple Measurement: From 0.5 to 1.1 cm in greatest dimension and 0.1 cm in width Description / comments: Fibrofatty soft tissue cores Inked: Black Entirely submitted. Block summary: 1- section E 2- section F 3- section H 4 - section I 5-7-entirely submitted breast cores   Final Diagnosis performed by Betsy Pries, MD.  Electronically signed 01/09/2019 10:39:49AM The electronic signature indicates that the named Attending Pathologist has evaluated the specimen Technical component performed at [facility], [address] [place], [place] [address] Lab: [phone] Dir: Rush Farmer, MD, MMM  Professional component performed at [facility], [facility], [place], [place], [place] [address] Lab: [phone] Dir: Dellia Nims. Reuel Derby, MD     RADS: Addendum by Tracey Harries, MD on 01/13/2019 12:51 PM ADDENDUM REPORT: 01/10/2019 14:12  ADDENDUM: PATHOLOGY revealed: A. RIGHT BREAST; CALCIFICATIONS; STEREOTACTIC NEEDLE CORE BIOPSY: - DUCTAL CARCINOMA IN SITU, HIGH GRADE COMEDO TYPE, WITH ASSOCIATED CALCIFICATIONS. Comment: DCIS is present in two of seven tissue blocks with the greatest linear extent measuring 3 mm. ER and PR are deferred to an excision specimen. If necessary, these stains could be performed on block A1.  Pathology results are CONCORDANT with imaging findings, per Dr. Audie Pinto.  Pathology results were discussed with patient via telephone. The patient reported doing well after the biopsy with no adverse symptoms, only tenderness at the site. Post biopsy care instructions were reviewed and questions were answered. The patient was encouraged to call [facility] for any additional questions or concerns.  Recommendation: Surgical referral. Per request, nurse navigators at [facility] have arranged for patient to see Dr Benjamine Sprague on 11/16/ 2020.  Addendum by Electa Sniff RN on  01/10/2019.   Electronically Signed  By: Audie Pinto M.D.  On: 01/10/2019 14:12 CLINICAL DATA: Screening recall for right breast calcifications.  EXAM: DIGITAL DIAGNOSTIC RIGHT MAMMOGRAM  COMPARISON: Previous exam(s).  ACR Breast Density Category b: There are scattered areas of fibroglandular density.  FINDINGS: Loosely grouped calcifications are noted in the upper outer right breast spanning 10 x 4 x 5 mm. Several are coarsened heterogeneous, few punctate. There is no associated mass or distortion. There is no linearity or branching.  IMPRESSION: Indeterminate right breast calcifications. Biopsy is recommended.  RECOMMENDATION: Stereotactic core needle biopsy of the right breast calcifications.  I have discussed the findings and recommendations with the patient. If applicable, a reminder letter will be sent to the patient regarding the next appointment.  BI-RADS CATEGORY 4: Suspicious.   Electronically Signed  By: Lajean Manes M.D.  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 The risks of general anesthetic, if used, includes MI, CVA, sudden death or even reaction to anesthetic medications also discussed.  Typical post-op recovery time and possbility of activity restrictions were also discussed.  Alternatives include continued observation.  Benefits include possible symptom relief, pathologic evaluation, and/or curative excision.   The patient verbalized understanding and all questions were answered to the patient's satisfaction.  2. Patient has  elected to proceed with surgical treatment. Lumpectomy with SLNB. Procedure will be scheduled.  Written consent was obtained.

## 2019-01-15 NOTE — H&P (Signed)
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 female who was referred by [facility] for evaluation of above. Change was noted on last screening mammogram. Patient does not routinely do self breast exams.  Patient has not noted a change on breast exam. Age of menarche was 4. Last menstrual period was 1983. Patient is G2P2.  Patient denies nipple discharge. Patient denies previous breast biopsy. Patient denies a personal history of breast cancer.   Past Medical History:  has a past medical history of GERD (gastroesophageal reflux disease), adenomatous colonic polyps (10/25/2018), Hyperlipidemia, Hypertension, Lumbar disc disease (07/30/2013), Other dysphagia (10/25/2018), and Thyroid nodule.  Past Surgical History:  has a past surgical history that includes Cholecystectomy; laminectomy posterior lumbar 1/2 segments; Hysterectomy; hemithyroidectomy (Left, 05/2014); Colonoscopy (04/28/2004); Colonoscopy (07/01/2008); and Colonoscopy (06/02/2013).  Family History: family history includes Breast cancer in her mother; High blood pressure (Hypertension) in her paternal aunt; Hyperlipidemia (Elevated cholesterol) in her paternal aunt; No Known Problems in her father; Stroke in her mother.  Social History:  reports that she has never smoked. She has never used smokeless tobacco. She reports current alcohol use. She reports that she does not use drugs.  Current Medications: has a current medication list which includes the following prescription(s): alprazolam, amlodipine, biotin, calcium/mag oxide/vitamin d3, cholecalciferol, duloxetine, estradiol, levothyroxine, montelukast, omeprazole, and tramadol.  Allergies:  Allergies as of 01/14/2019 - Reviewed 01/14/2019  Allergen Reaction Noted  . Ambien [zolpidem] Hallucination 06/13/2013  . Paxil [paroxetine hcl] Unknown 06/13/2013  .  Penicillins Other (See Comments)     ROS:  A 15 point review of systems was performed and was negative except as noted in HPI   Objective:     BP 141/80   Pulse 79   Ht 156.2 cm (5' 1.5")   Wt 49.9 kg (110 lb)   BMI 20.45 kg/m   Constitutional :  alert, appears stated age, cooperative and no distress  Lymphatics/Throat:  no asymmetry, masses, or scars  Respiratory:  clear to auscultation bilaterally  Cardiovascular:  regular rate and rhythm  Gastrointestinal: soft, non-tender; bowel sounds normal; no masses,  no organomegaly.   Musculoskeletal: Steady gait and movement  Skin: Cool and moist,   Psychiatric: Normal affect, non-agitated, not confused  Breast:  Chaperone present for exam.  breasts appear normal, except for post biopsy changes on right. no suspicious masses, no skin or nipple changes or axillary nodes.    LABS:  SURGICAL PATHOLOGY SURGICAL PATHOLOGY CASE: [phone] PATIENT: [name] Surgical Pathology Report     Specimen Submitted: A. Breast, right  Clinical History: X-shaped biopsy clip placed following stereotactic biopsy of calcs, UOQ right breast      DIAGNOSIS: A. RIGHT BREAST; CALCIFICATIONS; STEREOTACTIC NEEDLE CORE BIOPSY: - DUCTAL CARCINOMA IN SITU, HIGH GRADE COMEDO TYPE, WITH ASSOCIATED CALCIFICATIONS.  Comment: DCIS is present in two of seven tissue blocks with the greatest linear extent measuring 3 mm. ER and PR are deferred to an excision specimen. If necessary, these stains could be performed on block A1. The findings were communicated to Electa Sniff, RN via [facility] secure chat on 01/09/19.   GROSS DESCRIPTION: A. Labeled: Right breast stereotactic core biopsies-calcs Received: in a formalin-filled CoreTainer transport system Accompanying specimen radiograph: Yes Time / date in fixative: Tissue procedure time 9:16 AM, tissue put in formalin time 9:20 AM 01/08/2019 Cold ischemic time: 4 minutes Total fixation  time: 8  hours Core pieces: Multiple Measurement: From 0.5 to 1.1 cm in greatest dimension and 0.1 cm in width Description / comments: Fibrofatty soft tissue cores Inked: Black Entirely submitted. Block summary: 1- section E 2- section F 3- section H 4 - section I 5-7-entirely submitted breast cores   Final Diagnosis performed by Betsy Pries, MD.  Electronically signed 01/09/2019 10:39:49AM The electronic signature indicates that the named Attending Pathologist has evaluated the specimen Technical component performed at [facility], [address] [place], [place] [address] Lab: [phone] Dir: Rush Farmer, MD, MMM  Professional component performed at [facility], [facility], [place], [place], [place] [address] Lab: [phone] Dir: Dellia Nims. Reuel Derby, MD     RADS: Addendum by Tracey Harries, MD on 01/13/2019 12:51 PM ADDENDUM REPORT: 01/10/2019 14:12  ADDENDUM: PATHOLOGY revealed: A. RIGHT BREAST; CALCIFICATIONS; STEREOTACTIC NEEDLE CORE BIOPSY: - DUCTAL CARCINOMA IN SITU, HIGH GRADE COMEDO TYPE, WITH ASSOCIATED CALCIFICATIONS. Comment: DCIS is present in two of seven tissue blocks with the greatest linear extent measuring 3 mm. ER and PR are deferred to an excision specimen. If necessary, these stains could be performed on block A1.  Pathology results are CONCORDANT with imaging findings, per Dr. Audie Pinto.  Pathology results were discussed with patient via telephone. The patient reported doing well after the biopsy with no adverse symptoms, only tenderness at the site. Post biopsy care instructions were reviewed and questions were answered. The patient was encouraged to call [facility] for any additional questions or concerns.  Recommendation: Surgical referral. Per request, nurse navigators at [facility] have arranged for patient to see Dr Benjamine Sprague on 11/16/ 2020.  Addendum by Electa Sniff RN on  01/10/2019.   Electronically Signed  By: Audie Pinto M.D.  On: 01/10/2019 14:12 CLINICAL DATA: Screening recall for right breast calcifications.  EXAM: DIGITAL DIAGNOSTIC RIGHT MAMMOGRAM  COMPARISON: Previous exam(s).  ACR Breast Density Category b: There are scattered areas of fibroglandular density.  FINDINGS: Loosely grouped calcifications are noted in the upper outer right breast spanning 10 x 4 x 5 mm. Several are coarsened heterogeneous, few punctate. There is no associated mass or distortion. There is no linearity or branching.  IMPRESSION: Indeterminate right breast calcifications. Biopsy is recommended.  RECOMMENDATION: Stereotactic core needle biopsy of the right breast calcifications.  I have discussed the findings and recommendations with the patient. If applicable, a reminder letter will be sent to the patient regarding the next appointment.  BI-RADS CATEGORY 4: Suspicious.   Electronically Signed  By: Lajean Manes M.D.  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 The risks of general anesthetic, if used, includes MI, CVA, sudden death or even reaction to anesthetic medications also discussed.  Typical post-op recovery time and possbility of activity restrictions were also discussed.  Alternatives include continued observation.  Benefits include possible symptom relief, pathologic evaluation, and/or curative excision.   The patient verbalized understanding and all questions were answered to the patient's satisfaction.  2. Patient has  elected to proceed with surgical treatment. Lumpectomy with SLNB. Procedure will be scheduled.  Written consent was obtained.

## 2019-01-15 NOTE — Progress Notes (Signed)
Pt new patient, here for DCIS. No specific questions or concerns.

## 2019-01-16 ENCOUNTER — Other Ambulatory Visit: Payer: Self-pay | Admitting: *Deleted

## 2019-01-20 ENCOUNTER — Inpatient Hospital Stay: Payer: Medicare Other

## 2019-01-22 ENCOUNTER — Other Ambulatory Visit: Payer: Self-pay

## 2019-01-22 ENCOUNTER — Other Ambulatory Visit
Admission: RE | Admit: 2019-01-22 | Discharge: 2019-01-22 | Disposition: A | Discharge status: Home / Self Care | Payer: Medicare Other | Source: Ambulatory Visit | Attending: Internal Medicine | Admitting: Internal Medicine

## 2019-01-22 DIAGNOSIS — Z20828 Contact with and (suspected) exposure to other viral communicable diseases: Secondary | ICD-10-CM | POA: Diagnosis not present

## 2019-01-22 DIAGNOSIS — Z01812 Encounter for preprocedural laboratory examination: Secondary | ICD-10-CM | POA: Insufficient documentation

## 2019-01-22 LAB — SARS CORONAVIRUS 2 (TAT 6-24 HRS): SARS Coronavirus 2: NEGATIVE

## 2019-01-24 ENCOUNTER — Other Ambulatory Visit: Payer: Medicare Other

## 2019-01-26 MED ORDER — CLINDAMYCIN PHOSPHATE 900 MG/50ML IV SOLN: 900.0000 mg | INTRAVENOUS | Status: DC

## 2019-01-27 ENCOUNTER — Ambulatory Visit: Payer: Medicare Other | Admitting: Anesthesiology

## 2019-01-27 ENCOUNTER — Encounter
Admission: RE | Disposition: A | Discharge status: Home / Self Care | Payer: Self-pay | Source: Home / Self Care | Attending: Internal Medicine

## 2019-01-27 ENCOUNTER — Other Ambulatory Visit: Payer: Self-pay

## 2019-01-27 ENCOUNTER — Ambulatory Visit
Admission: RE | Admit: 2019-01-27 | Discharge: 2019-01-27 | Disposition: A | Discharge status: Home / Self Care | Payer: Medicare Other | Attending: Internal Medicine | Admitting: Internal Medicine

## 2019-01-27 ENCOUNTER — Encounter: Payer: Self-pay | Admitting: Certified Registered Nurse Anesthetist

## 2019-01-27 DIAGNOSIS — Z79899 Other long term (current) drug therapy: Secondary | ICD-10-CM | POA: Diagnosis not present

## 2019-01-27 DIAGNOSIS — K219 Gastro-esophageal reflux disease without esophagitis: Secondary | ICD-10-CM | POA: Insufficient documentation

## 2019-01-27 DIAGNOSIS — Z86010 Personal history of colonic polyps: Secondary | ICD-10-CM | POA: Diagnosis present

## 2019-01-27 DIAGNOSIS — Z1211 Encounter for screening for malignant neoplasm of colon: Secondary | ICD-10-CM | POA: Diagnosis not present

## 2019-01-27 DIAGNOSIS — K317 Polyp of stomach and duodenum: Secondary | ICD-10-CM | POA: Diagnosis not present

## 2019-01-27 DIAGNOSIS — L83 Acanthosis nigricans: Secondary | ICD-10-CM | POA: Insufficient documentation

## 2019-01-27 DIAGNOSIS — I10 Essential (primary) hypertension: Secondary | ICD-10-CM | POA: Diagnosis not present

## 2019-01-27 DIAGNOSIS — Z888 Allergy status to other drugs, medicaments and biological substances status: Secondary | ICD-10-CM | POA: Diagnosis not present

## 2019-01-27 DIAGNOSIS — F329 Major depressive disorder, single episode, unspecified: Secondary | ICD-10-CM | POA: Insufficient documentation

## 2019-01-27 DIAGNOSIS — K5730 Diverticulosis of large intestine without perforation or abscess without bleeding: Secondary | ICD-10-CM | POA: Insufficient documentation

## 2019-01-27 DIAGNOSIS — Z79890 Hormone replacement therapy (postmenopausal): Secondary | ICD-10-CM | POA: Diagnosis not present

## 2019-01-27 DIAGNOSIS — E039 Hypothyroidism, unspecified: Secondary | ICD-10-CM | POA: Diagnosis not present

## 2019-01-27 DIAGNOSIS — K641 Second degree hemorrhoids: Secondary | ICD-10-CM | POA: Diagnosis not present

## 2019-01-27 DIAGNOSIS — F419 Anxiety disorder, unspecified: Secondary | ICD-10-CM | POA: Insufficient documentation

## 2019-01-27 DIAGNOSIS — R1310 Dysphagia, unspecified: Secondary | ICD-10-CM | POA: Diagnosis present

## 2019-01-27 DIAGNOSIS — Z880 Allergy status to penicillin: Secondary | ICD-10-CM | POA: Diagnosis not present

## 2019-01-27 DIAGNOSIS — K2950 Unspecified chronic gastritis without bleeding: Secondary | ICD-10-CM | POA: Insufficient documentation

## 2019-01-27 HISTORY — PX: ESOPHAGOGASTRODUODENOSCOPY (EGD) WITH PROPOFOL: SHX5813

## 2019-01-27 HISTORY — PX: COLONOSCOPY WITH PROPOFOL: SHX5780

## 2019-01-27 SURGERY — COLONOSCOPY WITH PROPOFOL
Anesthesia: General

## 2019-01-27 MED ORDER — GLYCOPYRROLATE 0.2 MG/ML IJ SOLN
INTRAMUSCULAR | Status: AC
  Filled 2019-01-27: qty 1

## 2019-01-27 MED ORDER — GLYCOPYRROLATE 0.2 MG/ML IJ SOLN
INTRAMUSCULAR | Status: DC | PRN
  Administered 2019-01-27: 0.2 mg via INTRAVENOUS

## 2019-01-27 MED ORDER — SODIUM CHLORIDE 0.9 % IV SOLN
INTRAVENOUS | Status: DC
  Administered 2019-01-27: 08:00:00 via INTRAVENOUS

## 2019-01-27 MED ORDER — PROPOFOL 10 MG/ML IV BOLUS
INTRAVENOUS | Status: DC | PRN
  Administered 2019-01-27: 80 mg via INTRAVENOUS
  Administered 2019-01-27 (×2): 15 mg via INTRAVENOUS

## 2019-01-27 MED ORDER — PROPOFOL 500 MG/50ML IV EMUL
INTRAVENOUS | Status: AC
  Filled 2019-01-27: qty 50

## 2019-01-27 MED ORDER — LIDOCAINE HCL (CARDIAC) PF 100 MG/5ML IV SOSY
PREFILLED_SYRINGE | INTRAVENOUS | Status: DC | PRN
  Administered 2019-01-27: 50 mg via INTRAVENOUS

## 2019-01-27 MED ORDER — LIDOCAINE HCL (PF) 2 % IJ SOLN
INTRAMUSCULAR | Status: AC
  Filled 2019-01-27: qty 10

## 2019-01-27 MED ORDER — PROPOFOL 500 MG/50ML IV EMUL
INTRAVENOUS | Status: DC | PRN
  Administered 2019-01-27: 130 ug/kg/min via INTRAVENOUS

## 2019-01-27 NOTE — Transfer of Care (Signed)
Immediate Anesthesia Transfer of Care Note  Patient: YOON KNEE  Procedure(s) Performed: COLONOSCOPY WITH PROPOFOL (N/A ) ESOPHAGOGASTRODUODENOSCOPY (EGD) WITH PROPOFOL (N/A )  Patient Location: PACU and Endoscopy Unit  Anesthesia Type:General  Level of Consciousness: drowsy  Airway & Oxygen Therapy: Patient Spontanous Breathing  Post-op Assessment: Report given to RN and Post -op Vital signs reviewed and stable  Post vital signs: Reviewed and stable  Last Vitals:  Vitals Value Taken Time  BP 92/60 01/27/19 0901  Temp 36.1 C 01/27/19 0900  Pulse 71 01/27/19 0901  Resp 15 01/27/19 0901  SpO2 95 % 01/27/19 0901  Vitals shown include unvalidated device data.  Last Pain:  Vitals:   01/27/19 0900  TempSrc: Tympanic  PainSc: 0-No pain         Complications: No apparent anesthesia complications

## 2019-01-27 NOTE — Op Note (Signed)
[facility] Gastroenterology Patient Name: Chelsea Peters Procedure Date: 01/27/2019 7:53 AM MRN: [id number] Account #: [id number] Date of Birth: November 21, 1940 Admit Type: Outpatient Age: 78 Room: [facility] ENDO ROOM 3 Gender: Female Note Status: Finalized Procedure:             Upper GI endoscopy Indications:           Epigastric abdominal pain, Dysphagia, Esophageal reflux Providers:             Benay Pike. Alice Reichert MD, MD Referring MD:          Rusty Aus, MD (Referring MD) Medicines:             Propofol per Anesthesia Complications:         No immediate complications. Procedure:             Pre-Anesthesia Assessment:                        - The risks and benefits of the procedure and the                         sedation options and risks were discussed with the                         patient. All questions were answered and informed                         consent was obtained.                        - Patient identification and proposed procedure were                         verified prior to the procedure by the nurse. The                         procedure was verified in the procedure room.                        - ASA Grade Assessment: III - A patient with severe                         systemic disease.                        - After reviewing the risks and benefits, the patient                         was deemed in satisfactory condition to undergo the                         procedure.                        After obtaining informed consent, the endoscope was                         passed under direct vision. Throughout the procedure,                         the  patient's blood pressure, pulse, and oxygen                         saturations were monitored continuously. The Endoscope                         was introduced through the mouth, and advanced to the                         third part of duodenum. The upper GI endoscopy was     accomplished without difficulty. The patient tolerated                         the procedure well. Findings:      Mucosal changes including feline appearance were found in the entire       esophagus. Biopsies were obtained from the proximal and distal esophagus       with cold forceps for histology of suspected eosinophilic esophagitis.      There is no endoscopic evidence of stenosis, stricture, mass or       diverticula in the lower third of the esophagus.      Patchy moderate inflammation characterized by erythema was found in the       gastric antrum. Biopsies were taken with a cold forceps for Helicobacter       pylori testing.      Multiple small sessile polyps with no bleeding and no stigmata of recent       bleeding were found in the gastric body. Biopsies were taken with a cold       forceps for histology.      The examined duodenum was normal. Impression:            - Esophageal mucosal changes suspicious for                         eosinophilic esophagitis. Biopsied.                        - Gastritis. Biopsied.                        - Multiple gastric polyps. Biopsied.                        - Normal examined duodenum. Recommendation:        - Await pathology results.                        - Proceed with colonoscopy Procedure Code(s):     --- Professional ---                        [phone], Esophagogastroduodenoscopy, flexible,                         transoral; with biopsy, single or multiple Diagnosis Code(s):     --- Professional ---                        K21.9, Gastro-esophageal reflux disease without  esophagitis                        R13.10, Dysphagia, unspecified                        R10.13, Epigastric pain                        K31.7, Polyp of stomach and duodenum                        K29.70, Gastritis, unspecified, without bleeding                        K22.8, Other specified diseases of esophagus CPT copyright 2019 American  Medical Association. All rights reserved. The codes documented in this report are preliminary and upon coder review may  be revised to meet current compliance requirements. Efrain Sella MD, MD 01/27/2019 8:42:51 AM This report has been signed electronically. Number of Addenda: 0 Note Initiated On: 01/27/2019 7:53 AM Estimated Blood Loss:  Estimated blood loss: none.      [facility]

## 2019-01-27 NOTE — Anesthesia Post-op Follow-up Note (Signed)
Anesthesia QCDR form completed.        

## 2019-01-27 NOTE — Anesthesia Preprocedure Evaluation (Addendum)
Anesthesia Evaluation  Patient identified by MRN, date of birth, ID band Patient awake    Reviewed: Allergy & Precautions, H&P , NPO status , Patient's Chart, lab work & pertinent test results  Airway Mallampati: II  TM Distance: >3 FB     Dental  (+) Chipped   Pulmonary neg pulmonary ROS, neg shortness of breath, neg COPD, neg recent URI,  H/o Covid-19 in July          Cardiovascular hypertension, (-) angina(-) Past MI and (-) Cardiac Stents (-) dysrhythmias      Neuro/Psych PSYCHIATRIC DISORDERS Depression negative neurological ROS     GI/Hepatic Neg liver ROS, GERD  Controlled,  Endo/Other  Hypothyroidism   Renal/GU negative Renal ROS  negative genitourinary   Musculoskeletal   Abdominal   Peds  Hematology negative hematology ROS (+)   Anesthesia Other Findings Past Medical History: No date: GERD (gastroesophageal reflux disease) No date: Hyperlipidemia No date: Hypertension No date: Hypothyroidism No date: Lumbar disc disease  Past Surgical History: 01/08/2019: BREAST BIOPSY; Right     Comment:  Affirm Biopsy- X-Clip- Path pending 06/02/2015: CARDIAC CATHETERIZATION; N/A     Comment:  Procedure: Left Heart Cath and Coronary Angiography;                Surgeon: Teodoro Spray, MD;  Location: [facility]               LAB;  Service: Cardiovascular;  Laterality: N/A;     Reproductive/Obstetrics negative OB ROS                            Anesthesia Physical Anesthesia Plan  ASA: II  Anesthesia Plan: General   Post-op Pain Management:    Induction:   PONV Risk Score and Plan: Propofol infusion and TIVA  Airway Management Planned: Natural Airway and Nasal Cannula  Additional Equipment:   Intra-op Plan:   Post-operative Plan:   Informed Consent: I have reviewed the patients History and Physical, chart, labs and discussed the procedure including the risks, benefits  and alternatives for the proposed anesthesia with the patient or authorized representative who has indicated his/her understanding and acceptance.     Dental Advisory Given  Plan Discussed with: Anesthesiologist  Anesthesia Plan Comments:         Anesthesia Quick Evaluation

## 2019-01-27 NOTE — Op Note (Signed)
[facility] Gastroenterology Patient Name: Chelsea Peters Procedure Date: 01/27/2019 7:53 AM MRN: [id number] Account #: [id number] Date of Birth: 06/27/1940 Admit Type: Outpatient Age: 78 Room: [facility] ENDO ROOM 3 Gender: Female Note Status: Finalized Procedure:             Colonoscopy Indications:           High risk colon cancer surveillance: Personal history                         of non-advanced adenoma Providers:             Benay Pike. Alexandrina Fiorini MD, MD Medicines:             Propofol per Anesthesia Complications:         No immediate complications. Procedure:             Pre-Anesthesia Assessment:                        - The risks and benefits of the procedure and the                         sedation options and risks were discussed with the                         patient. All questions were answered and informed                         consent was obtained.                        - Patient identification and proposed procedure were                         verified prior to the procedure by the nurse. The                         procedure was verified in the procedure room.                        - ASA Grade Assessment: III - A patient with severe                         systemic disease.                        - After reviewing the risks and benefits, the patient                         was deemed in satisfactory condition to undergo the                         procedure.                        After obtaining informed consent, the colonoscope was                         passed under direct vision. Throughout the procedure,  the patient's blood pressure, pulse, and oxygen                         saturations were monitored continuously. The                         Colonoscope was introduced through the anus and                         advanced to the the cecum, identified by appendiceal                         orifice and ileocecal  valve. The colonoscopy was                         performed without difficulty. The patient tolerated                         the procedure well. The quality of the bowel                         preparation was good. The ileocecal valve, appendiceal                         orifice, and rectum were photographed. Findings:      The perianal and digital rectal examinations were normal. Pertinent       negatives include normal sphincter tone and no palpable rectal lesions.      A few small-mouthed diverticula were found in the sigmoid colon.      Non-bleeding internal hemorrhoids were found during retroflexion. The       hemorrhoids were Grade II (internal hemorrhoids that prolapse but reduce       spontaneously).      The exam was otherwise without abnormality. Impression:            - Diverticulosis in the sigmoid colon.                        - Non-bleeding internal hemorrhoids.                        - The examination was otherwise normal.                        - No specimens collected. Recommendation:        - Patient has a contact number available for                         emergencies. The signs and symptoms of potential                         delayed complications were discussed with the patient.                         Return to normal activities tomorrow. Written                         discharge instructions were provided to the patient.                        -  Await pathology results from EGD, also performed                         today.                        - Resume previous diet.                        - Continue present medications.                        - No repeat colonoscopy due to current age (76 years                         or older) and the absence of advanced adenomas.                        - Return to physician assistant in 3 months.                        - The findings and recommendations were discussed with                         the patient. Procedure  Code(s):     --- Professional ---                        [id number], Colorectal cancer screening; colonoscopy on                         individual at high risk Diagnosis Code(s):     --- Professional ---                        K57.30, Diverticulosis of large intestine without                         perforation or abscess without bleeding                        K64.1, Second degree hemorrhoids                        Z86.010, Personal history of colonic polyps CPT copyright 2019 American Medical Association. All rights reserved. The codes documented in this report are preliminary and upon coder review may  be revised to meet current compliance requirements. Efrain Sella MD, MD 01/27/2019 8:59:56 AM This report has been signed electronically. Number of Addenda: 0 Note Initiated On: 01/27/2019 7:53 AM Scope Withdrawal Time: 0 hours 6 minutes 5 seconds  Total Procedure Duration: 0 hours 11 minutes 43 seconds  Estimated Blood Loss:  Estimated blood loss: none.      [facility]

## 2019-01-27 NOTE — Anesthesia Postprocedure Evaluation (Signed)
Anesthesia Post Note  Patient: Chelsea Peters  Procedure(s) Performed: COLONOSCOPY WITH PROPOFOL (N/A ) ESOPHAGOGASTRODUODENOSCOPY (EGD) WITH PROPOFOL (N/A )  Patient location during evaluation: PACU Anesthesia Type: General Level of consciousness: awake and alert Pain management: pain level controlled Vital Signs Assessment: post-procedure vital signs reviewed and stable Respiratory status: spontaneous breathing, nonlabored ventilation and respiratory function stable Cardiovascular status: blood pressure returned to baseline and stable Postop Assessment: no apparent nausea or vomiting Anesthetic complications: no     Last Vitals:  Vitals:   01/27/19 0920 01/27/19 0930  BP: 134/81 125/68  Pulse: 72 67  Resp: 13 16  Temp:    SpO2: 100% 99%    Last Pain:  Vitals:   01/27/19 0930  TempSrc:   PainSc: 0-No pain                 Durenda Hurt

## 2019-01-27 NOTE — Interval H&P Note (Signed)
History and Physical Interval Note:  01/27/2019 8:29 AM  Chelsea Peters  has presented today for surgery, with the diagnosis of REFLUX ESOPHAGITIS DYSPHAGIA HX ADEN POLYPS.  The various methods of treatment have been discussed with the patient and family. After consideration of risks, benefits and other options for treatment, the patient has consented to  Procedure(s): COLONOSCOPY WITH PROPOFOL (N/A) ESOPHAGOGASTRODUODENOSCOPY (EGD) WITH PROPOFOL (N/A) as a surgical intervention.  The patient's history has been reviewed, patient examined, no change in status, stable for surgery.  I have reviewed the patient's chart and labs.  Questions were answered to the patient's satisfaction.     [place], [place]

## 2019-01-27 NOTE — H&P (Signed)
Outpatient short stay form Pre-procedure 01/27/2019 8:27 AM Chelsea Peters, M.D.  Primary Physician: [name], M.D.  Reason for visit:  Dysphagia, GERD, personal hx of colon polyps, hemorrhoids.  History of present illness:                            Patient presents for colonoscopy for a personal hx of colon polyps. The patient denies abdominal pain, abnormal weight loss or rectal bleeding. Patient GERD with dysphagia to solids only intermittently.     Current Facility-Administered Medications:  .  0.9 %  sodium chloride infusion, , Intravenous, Continuous, [place], Benay Pike, MD, Last Rate: 20 mL/hr at 01/27/19 0759 .  clindamycin (CLEOCIN) IVPB 900 mg, 900 mg, Intravenous, On Call to OR, Sakai, Isami, DO  Medications Prior to Admission  Medication Sig Dispense Refill Last Dose  . acetaminophen (TYLENOL) 500 MG tablet Take 500-1,000 mg by mouth every 6 (six) hours as needed (pain.).   Past Week at Unknown time  . ALPRAZolam (XANAX) 0.25 MG tablet Take 0.25 mg by mouth daily as needed for anxiety.   Past Week at Unknown time  . amLODipine (NORVASC) 5 MG tablet Take 5 mg by mouth daily.  3 01/27/2019 at 0600  . Biotin 1 MG CAPS Take 1 mg by mouth daily.   01/26/2019 at 0800  . Cholecalciferol (D 2000) 2000 units TABS Take 2,000 Units by mouth daily.   01/26/2019 at Unknown time  . levothyroxine (SYNTHROID, LEVOTHROID) 25 MCG tablet Take 25 mcg by mouth daily.    01/26/2019 at 0800  . Multiple Vitamin (MULTIVITAMIN WITH MINERALS) TABS tablet Take 1 tablet by mouth daily.   01/26/2019 at 0800  . omeprazole (PRILOSEC) 20 MG capsule Take 20 mg by mouth daily.   0 01/26/2019 at 0800  . DULoxetine (CYMBALTA) 30 MG capsule Take 30 mg by mouth daily. Reported on 06/02/2015  0 Not Taking at Unknown time  . estradiol (ESTRACE) 1 MG tablet Take 0.5 mg by mouth daily.   Not Taking at Unknown time  . FLUoxetine (PROZAC) 20 MG capsule Take 20 mg by mouth daily.     . ondansetron (ZOFRAN) 4 MG  tablet Take 1 tablet (4 mg total) by mouth daily as needed. (Patient not taking: Reported on 01/15/2019) 14 tablet 0 Not Taking at Unknown time  . traMADol (ULTRAM) 50 MG tablet Take 50 mg by mouth every 12 (twelve) hours as needed (for leg pain.).    Not Taking at Unknown time     Allergies  Allergen Reactions  . Ambien [Zolpidem] Other (See Comments)    erratic behavior/"made me crazy"  . Paxil [Paroxetine] Other (See Comments)    Stomach pains.  . Penicillins Other (See Comments)    Blisters in nose Did it involve swelling of the face/tongue/throat, SOB, or low BP? No Did it involve sudden or severe rash/hives, skin peeling, or any reaction on the inside of your mouth or nose? Yes--blisters in nose Did you need to seek medical attention at a hospital or doctor's office? No When did it last happen?More than 10 years ago If all above answers are "NO", may proceed with cephalosporin use.      Past Medical History:  Diagnosis Date  . GERD (gastroesophageal reflux disease)   . Hyperlipidemia   . Hypertension   . Hypothyroidism   . Lumbar disc disease     Review of systems:  Otherwise negative.  Physical Exam  Gen: Alert, oriented. Appears stated age.  HEENT: [place]/AT. PERRLA. Lungs: CTA, no wheezes. CV: RR nl S1, S2. Abd: soft, benign, no masses. BS+ Ext: No edema. Pulses 2+    Planned procedures: Proceed with EGD and  colonoscopy. The patient understands the nature of the planned procedure, indications, risks, alternatives and potential complications including but not limited to bleeding, infection, perforation, damage to internal organs and possible oversedation/side effects from anesthesia. The patient agrees and gives consent to proceed.  Please refer to procedure notes for findings, recommendations and patient disposition/instructions.     Chelsea Peters, M.D. Gastroenterology 01/27/2019  8:27 AM

## 2019-01-28 ENCOUNTER — Encounter: Payer: Self-pay | Admitting: Internal Medicine

## 2019-01-28 DIAGNOSIS — C50919 Malignant neoplasm of unspecified site of unspecified female breast: Secondary | ICD-10-CM

## 2019-01-28 HISTORY — DX: Malignant neoplasm of unspecified site of unspecified female breast: C50.919

## 2019-01-28 LAB — SURGICAL PATHOLOGY

## 2019-01-29 ENCOUNTER — Encounter
Admission: RE | Admit: 2019-01-29 | Discharge: 2019-01-29 | Disposition: A | Discharge status: Home / Self Care | Payer: Medicare Other | Source: Ambulatory Visit | Attending: Surgery | Admitting: Surgery

## 2019-01-29 ENCOUNTER — Other Ambulatory Visit: Payer: Self-pay

## 2019-01-29 HISTORY — DX: Nonrheumatic mitral (valve) prolapse: I34.1

## 2019-01-29 NOTE — Patient Instructions (Signed)
Your procedure is scheduled on: 02-06-19 THURSDAY Report to [facility] (2ND DESK ON RIGHT) @ 11:45 AM  Remember: Instructions that are not followed completely may result in serious medical risk, up to and including death, or upon the discretion of your surgeon and anesthesiologist your surgery may need to be rescheduled.    _x___ 1. Do not eat food after midnight the night before your procedure. NO GUM OR CANDY AFTER MIDNIGHT. You may drink clear liquids up to 2 hours before you are scheduled to arrive at the hospital for your procedure.  Do not drink clear liquids within 2 hours of your scheduled arrival to the hospital.  Clear liquids include  --Water or Apple juice without pulp  --Gatorade  --Black Coffee or Clear Tea (No milk, no creamers, do not add anything to the coffee or Tea   ____Ensure clear carbohydrate drink on the way to the hospital for bariatric patients  ____Ensure clear carbohydrate drink 3 hours before surgery.    __x__ 2. No Alcohol for 24 hours before or after surgery.   __x__3. No Smoking or e-cigarettes for 24 prior to surgery.  Do not use any chewable tobacco products for at least 6 hour prior to surgery   ____  4. Bring all medications with you on the day of surgery if instructed.    __x__ 5. Notify your doctor if there is any change in your medical condition     (cold, fever, infections).    x___6. On the morning of surgery brush your teeth with toothpaste and water.  You may rinse your mouth with mouth wash if you wish.  Do not swallow any toothpaste or mouthwash.   Do not wear jewelry, make-up, hairpins, clips or nail polish.  Do not wear lotions, powders, or perfumes.  Do not shave 48 hours prior to surgery. Men may shave face and neck.  Do not bring valuables to the hospital.    [facility] is not responsible for any belongings or valuables.               Contacts, dentures or bridgework may not be worn into surgery.  Leave your  suitcase in the car. After surgery it may be brought to your room.  For patients admitted to the hospital, discharge time is determined by your treatment team.  _  Patients discharged the day of surgery will not be allowed to drive home.  You will need someone to drive you home and stay with you the night of your procedure.    Please read over the following fact sheets that you were given:   [facility] Preparing for Surgery   _x___ TAKE THE FOLLOWING MEDICATION THE MORNING OF SURGERY WITH A SMALL SIP OF WATER. These include:  1. NORVASC (AMLODIPINE)  2. SYNTHROID (LEVOTHYROXINE)  3. PRILOSEC (OMEPRAZOLE)  4. TAKE AN EXTRA PRILOSEC THE NIGHT BEFORE YOUR SURGERY   5. YOU MAY TAKE YOUR XANAX (ALPRAZOLAM) THE MORNING OF SURGERY IF NEEDED  6.  ____Fleets enema or Magnesium Citrate as directed.   _x___ Use CHG Soap or sage wipes as directed on instruction sheet   ____ Use inhalers on the day of surgery and bring to hospital day of surgery  ____ Stop Metformin and Janumet 2 days prior to surgery.    ____ Take 1/2 of usual insulin dose the night before surgery and none on the morning surgery.   ____ Follow recommendations from Cardiologist, Pulmonologist or PCP regarding stopping Aspirin, Coumadin, Plavix ,  Eliquis, Effient, or Pradaxa, and Pletal.  X____Stop Anti-inflammatories such as Advil, Aleve, Ibuprofen, Motrin, Naproxen, Naprosyn, Goodies powders or aspirin products NOW-OK to take Tylenol OR TRAMADOL IF NEEDED   _x___ Stop supplements until after surgery-STOP BIOTIN NOW-MAY RESUME AFTER SURGERY   ____ Bring C-Pap to the hospital.

## 2019-01-29 NOTE — Pre-Procedure Instructions (Signed)
Echo complete10/07/2018 [place] Component Name Value Ref Range  LV Ejection Fraction (%) 55   Aortic Valve Regurgitation Grade none   Aortic Valve Stenosis Grade none   Aortic Valve Max Velocity (m/s) 1 m/sec  Aortic Valve Stenosis Mean Gradient (mmHg) 1.9 mmHg  Mitral Valve Regurgitation Grade mild   Mitral Valve Stenosis Grade none   Tricuspid Valve Regurgitation Grade mild   Tricuspid Valve Regurgitation Max Velocity (m/s) 2.7 m/sec  Right Ventricle Systolic Pressure (mmHg) XX123456 mmHg  LV End Diastolic Diameter (cm) 4 cm  LV End Systolic Diameter (cm) 2.9 cm  LV Septum Wall Thickness (cm) 1.1 cm  LV Posterior Wall Thickness (cm) 0.97 cm  Left Atrium Diameter (cm) 3 cm  Result Narrative              CARDIOLOGY DEPARTMENT         [place], [place]                  [id number]      [facility]              Acct #: [id number]      [address] [place], [place] [address]    Date: 12/02/2018 08: 35 AM                                Adult  Female  Age: 78 yrs      ECHOCARDIOGRAM REPORT               Outpatient                                KC^^KCWC    STUDY:CHEST WALL        TAPE:0000: 00: 0: 00: 00 MD1: Callwood, Dwayne, MD    ECHO:Yes  DOPPLER:Yes    FILE:0000-000-000    BP: 118/78 mmHg    COLOR:Yes  CONTRAST:No   MACHINE:Philips  RV BIOPSY:No     3D:No SOUND QLTY:Moderate      Height: 62 in   MEDIUM:None                       Weight: 109 lb                                BSA: 1.5 m2 _________________________________________________________________________________________        HISTORY: DOE         REASON: Assess, LV function       INDICATION: R06.02 Shortness of  breath _________________________________________________________________________________________ ECHOCARDIOGRAPHIC MEASUREMENTS 2D DIMENSIONS AORTA         Values  Normal Range  MAIN PA     Values  Normal Range        Annulus: nm*     [2.1-2.5]     PA Main: nm*    [1.5-2.1]       Aorta Sin: nm*     [2.7-3.3]  RIGHT VENTRICLE      ST Junction: nm*     [2.3-2.9]     RV Base: nm*    [<4.2]       Asc.Aorta: nm*     [2.3-3.1]     RV Mid: nm*    [<3.5] LEFT VENTRICLE  RV Length: nm*    [<8.6]         LVIDd: 4.0 cm    [3.9-5.3]  INFERIOR VENA CAVA         LVIDs: 2.9 cm            Max. IVC: nm*    [<=2.1]           FS: 26.0 %    [>25]      Min. IVC: nm*          SWT: 1.1 cm    [0.5-0.9]  ------------------          PWT: 0.97 cm   [0.5-0.9]  nm* - not measured LEFT ATRIUM        LA Diam: 3.0 cm    [2.7-3.8]      LA A4C Area: nm*     [<20]       LA Volume: nm*     [22-52] _________________________________________________________________________________________ ECHOCARDIOGRAPHIC DESCRIPTIONS AORTIC ROOT          Size: Normal       Dissection: INDETERM FOR DISSECTION AORTIC VALVE        Leaflets: Tricuspid          Morphology: Normal        Mobility: Fully mobile LEFT VENTRICLE          Size: Normal            Anterior: HYPERCONTRACTILE      Contraction: Normal             Lateral: HYPERCONTRACTILE       Closest EF: >55% (Estimated)        Septal: HYPERCONTRACTILE       LV Masses: No Masses            Apical: HYPERCONTRACTILE          LVH: None             Inferior: HYPERCONTRACTILE                            Posterior: HYPERCONTRACTILE      Dias.FxClass: (Grade 1) relaxation abnormal, E/A reversal MITRAL VALVE        Leaflets: Normal            Mobility: Fully mobile       Morphology: Normal LEFT ATRIUM          Size: Normal            LA Masses: No masses       IA Septum: Normal IAS MAIN PA          Size: Normal PULMONIC VALVE        Leaflets: Tricuspid RIGHT VENTRICLE          Size: Normal            Free Wall: Normal      Contraction: Normal TRICUSPID VALVE        Leaflets: Normal            Mobility: Fully mobile       Morphology: Normal RIGHT ATRIUM          Size: Normal            RA Other: None        RA Mass: No masses PERICARDIUM         Fluid: No effusion INFERIOR VENACAVA          Size:  Normal Normal respiratory collapse _________________________________________________________________________________________  DOPPLER ECHO and OTHER SPECIAL PROCEDURES         Aortic: No AR           No AS             103.4 cm/sec peak vel   4.3 mmHg peak grad             1.9 mmHg mean grad     2.4 cm^2 by DOPPLER         Mitral: MILD MR          No MS             MV Inflow E Vel = 81.8 cm/sec   MV Annulus E'Vel = 7.1 cm/sec             E/E'Ratio = 11.5       Tricuspid: MILD TR          No TS             267.4 cm/sec peak TR vel  31.6 mmHg peak RV pressure       Pulmonary: TRIVIAL PR         No PS _________________________________________________________________________________________ INTERPRETATION NORMAL LEFT VENTRICULAR SYSTOLIC FUNCTION WITH AN ESTIMATED EF = >55 % NORMAL RIGHT VENTRICULAR SYSTOLIC FUNCTION MILD TRICUSPID AND MITRAL VALVE INSUFFICIENCY NO VALVULAR  STENOSIS _________________________________________________________________________________________ Electronically signed by  Lujean Amel, MD on 12/03/2018 08: 55 AM      Performed By: Cephus Shelling, RVT   Ordering Physician: Lujean Amel, MD _________________________________________________________________________________________  Other Result Information  Interface, Text Results In - 12/03/2018  8:56 AM EDT                        CARDIOLOGY DEPARTMENT                 [place], [place]                                    [id number]           [place] #: [id number]           [address], [place], [place] [address]       Date: 12/02/2018 08: 37 AM                                                              Adult   Female   Age: 62 yrs           ECHOCARDIOGRAM REPORT                              Outpatient  KC^^KCWC      STUDY:CHEST WALL               TAPE:0000: 00: 0: 00: 00 MD1: Callwood, Dwayne, MD       ECHO:Yes    DOPPLER:Yes       FILE:0000-000-000        BP: 118/78 mmHg      COLOR:Yes   CONTRAST:No     MACHINE:Philips  RV BIOPSY:No          3D:No  SOUND QLTY:Moderate            Height: 62 in     MEDIUM:None                                              Weight: 109 lb                                                              BSA: 1.5 m2 _________________________________________________________________________________________               HISTORY: DOE                REASON: Assess, LV function            INDICATION: R06.02 Shortness of breath _________________________________________________________________________________________ ECHOCARDIOGRAPHIC MEASUREMENTS 2D DIMENSIONS AORTA                  Values   Normal Range   MAIN PA         Values    Normal Range               Annulus: nm*          [2.1-2.5]         PA Main: nm*        [1.5-2.1]             Aorta Sin: nm*          [2.7-3.3]    RIGHT VENTRICLE           ST Junction: nm*          [2.3-2.9]         RV Base: nm*       [<4.2]             Asc.Aorta: nm*          [2.3-3.1]          RV Mid: nm*       [<3.5] LEFT VENTRICLE                                      RV Length: nm*       [<8.6]                 LVIDd: 4.0 cm       [3.9-5.3]    INFERIOR VENA CAVA                 LVIDs: 2.9 cm  Max. IVC: nm*       [<=2.1]                    FS: 26.0 %       [>25]            Min. IVC: nm*                   SWT: 1.1 cm       [0.5-0.9]    ------------------                   PWT: 0.97 cm      [0.5-0.9]    nm* - not measured LEFT ATRIUM               LA Diam: 3.0 cm       [2.7-3.8]           LA A4C Area: nm*          [<20]             LA Volume: nm*          [22-52] _________________________________________________________________________________________ ECHOCARDIOGRAPHIC DESCRIPTIONS AORTIC ROOT                  Size: Normal            Dissection: INDETERM FOR DISSECTION AORTIC VALVE              Leaflets: Tricuspid                   Morphology: Normal              Mobility: Fully mobile LEFT VENTRICLE                  Size: Normal                        Anterior: HYPERCONTRACTILE           Contraction: Normal                         Lateral: HYPERCONTRACTILE            Closest EF: >55% (Estimated)                Septal: HYPERCONTRACTILE             LV Masses: No Masses                       Apical: HYPERCONTRACTILE                   LVH: None                          Inferior: HYPERCONTRACTILE                                                     Posterior: HYPERCONTRACTILE          Dias.FxClass: (Grade 1) relaxation abnormal, E/A reversal MITRAL VALVE              Leaflets: Normal  Mobility: Fully mobile            Morphology: Normal LEFT ATRIUM                  Size: Normal                       LA Masses: No masses              IA Septum: Normal IAS MAIN PA                  Size: Normal PULMONIC VALVE              Leaflets: Tricuspid RIGHT VENTRICLE                  Size: Normal                       Free Wall: Normal           Contraction: Normal TRICUSPID VALVE              Leaflets: Normal                        Mobility: Fully mobile            Morphology: Normal RIGHT ATRIUM                  Size: Normal                        RA Other: None               RA Mass: No masses PERICARDIUM                 Fluid: No effusion INFERIOR VENACAVA                  Size: Normal Normal respiratory collapse _________________________________________________________________________________________  DOPPLER ECHO and OTHER SPECIAL PROCEDURES                Aortic: No AR                      No AS                        103.4 cm/sec peak vel      4.3 mmHg peak grad                        1.9 mmHg mean grad         2.4 cm^2 by DOPPLER                Mitral: MILD MR                    No MS                        MV Inflow E Vel = 81.8 cm/sec     MV Annulus E'Vel = 7.1 cm/sec                        E/E'Ratio = 11.5             Tricuspid: MILD TR  No TS                        267.4 cm/sec peak TR vel   31.6 mmHg peak RV pressure             Pulmonary: TRIVIAL PR                 No PS _________________________________________________________________________________________ INTERPRETATION NORMAL LEFT VENTRICULAR SYSTOLIC FUNCTION WITH AN ESTIMATED EF = >55 % NORMAL RIGHT VENTRICULAR SYSTOLIC FUNCTION MILD TRICUSPID AND MITRAL VALVE INSUFFICIENCY NO VALVULAR STENOSIS _________________________________________________________________________________________ Electronically signed by    Lujean Amel, MD on 12/03/2018 08: 55 AM          Performed By: Cephus Shelling, RVT    Ordering Physician: Lujean Amel,  MD _________________________________________________________________________________________  Status Results Details   Encounter Summary

## 2019-01-29 NOTE — Pre-Procedure Instructions (Signed)
NM myocardial perfusion SPECT multiple (stress and rest)12/03/2018 [place] Result Impression   Normal myocardial perfusion scan no evidence of stress-induced  myocardial ischemia ejection fraction of 78% conclusion negative scan  Result Narrative  CARDIOLOGY DEPARTMENT [address], [place], [place] [address] [phone]  Procedure: Pharmacologic Myocardial Perfusion Imaging  ONE day procedure  Indication: Angina at rest (CMS-HCC) Plan: NM myocardial perfusion SPECT multiple (stress     and rest), ECG stress test only  Ordering Physician:   Dr. Lujean Amel   Clinical History: 78 y.o. year old female recent anginal symptoms Vitals: Height: 62 in Weight: 109 lb Cardiac risk factors include:   Hyperlipidemia and HTN    Procedure:  Pharmacologic stress testing was performed with Regadenoson using a single  use 0.4mg /27ml (0.08 mg/ml) prefilled syringe intravenously infused as a  bolus dose over 10-15 seconds. The stress test was stopped due to Infusion  completion. Blood pressure response was normal. The patient did not  develop any symptoms other than fatigue during the procedure.   Rest HR: 63bpm Rest BP: 148/40mmHg Max HR: 79bpm Min BP: 132/69mmHg  Stress Test Administered by: Oswald Hillock, CMA  ECG Interpretation: Rest ECG: normal sinus rhythm, Nonspecific ST-T wave changes Stress ECG: normal sinus rhythm,  Recovery ECG: normal sinus rhythm ECG Interpretation: non-diagnostic due to pharmacologic testing.   Administrations This Visit   regadenoson (LEXISCAN) 0.4 mg/5 mL inj syringe 0.4 mg   Admin Date 12/02/2018 [place] Given Dose 0.4 mg Route Intravenous Administered By Herbert Seta, CNMT     technetium Tc98m sestamibi (CARDIOLITE) injection [id number] millicurie   Admin Date 12/02/2018 [place] Given Dose [id number] millicurie Route Intravenous Administered By Herbert Seta, CNMT     technetium Tc66m sestamibi (CARDIOLITE) injection [id number] millicurie   Admin Date 12/02/2018 [place] Given Dose [id number] millicurie Route Intravenous Administered By Herbert Seta, CNMT       Gated post-stress perfusion imaging was performed 30 minutes after stress.  Rest images were performed 30 minutes after injection.  Gated LV Analysis:   Summary of LV Perfusion: Normal,  Summary of LV Function: Normal   TID Ratio: 1.03  LVEF= 78 %  FINDINGS: Regional wall motion: reveals normal myocardial thickening and wall  motion. The overall quality of the study is good.  Artifacts noted: yes  Left ventricular cavity: normal.  Perfusion Analysis: SPECT images demonstrate homogeneous tracer  distribution throughout the myocardium. Defect type : Normal   Status Results Details   Encounter Summary  Echo complete

## 2019-02-03 ENCOUNTER — Other Ambulatory Visit
Admission: RE | Admit: 2019-02-03 | Discharge: 2019-02-03 | Disposition: A | Discharge status: Home / Self Care | Payer: Medicare Other | Source: Ambulatory Visit | Attending: Surgery | Admitting: Surgery

## 2019-02-03 ENCOUNTER — Other Ambulatory Visit: Payer: Self-pay

## 2019-02-03 DIAGNOSIS — U071 COVID-19: Secondary | ICD-10-CM | POA: Insufficient documentation

## 2019-02-03 DIAGNOSIS — Z01812 Encounter for preprocedural laboratory examination: Secondary | ICD-10-CM | POA: Diagnosis present

## 2019-02-03 LAB — SARS CORONAVIRUS 2 (TAT 6-24 HRS): SARS Coronavirus 2: POSITIVE — AB

## 2019-02-04 NOTE — Progress Notes (Signed)
Sherry at [place] office notified patient is COVID-19 positive.

## 2019-02-05 ENCOUNTER — Telehealth: Payer: Self-pay | Admitting: Nurse Practitioner

## 2019-02-05 NOTE — Telephone Encounter (Signed)
Called to Discuss with patient about Covid symptoms and the use of bamlanivimab, a monoclonal antibody infusion for those with mild to moderate Covid symptoms and at a high risk of hospitalization.     Pt is qualified for this infusion at the [facility] infusion center due to co-morbid conditions and/or a member of an at-risk group.    Patient is being managed for the following: Patient Active Problem List   Diagnosis Date Noted  . 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 History of 2019 novel coronavirus disease (COVID-19) 11/07/2018  . Hx of adenomatous colonic polyps 10/25/2018  . Recurrent major depressive disorder, in partial remission ([place]) 03/27/2018  . Medicare annual wellness visit, initial 04/24/2017  . Benign essential hypertension 03/21/2016  . Chest pain syndrome 06/02/2015  . BP (high blood pressure) 06/13/2013  . Hyperlipidemia, mixed 06/13/2013    Patient states that she is not having any symptoms.  Symptoms tier reviewed as well as criteria for ending isolation. Preventative practices reviewed. Patient verbalized understanding.

## 2019-02-06 ENCOUNTER — Ambulatory Visit: Payer: Medicare Other | Admitting: Oncology

## 2019-02-06 ENCOUNTER — Ambulatory Visit: Payer: Medicare Other

## 2019-02-13 ENCOUNTER — Ambulatory Visit: Payer: Medicare Other | Admitting: Anesthesiology

## 2019-02-13 ENCOUNTER — Ambulatory Visit
Admission: RE | Admit: 2019-02-13 | Discharge: 2019-02-13 | Disposition: A | Discharge status: Home / Self Care | Payer: Medicare Other | Source: Home / Self Care | Attending: Surgery | Admitting: Surgery

## 2019-02-13 ENCOUNTER — Ambulatory Visit
Admission: RE | Admit: 2019-02-13 | Discharge: 2019-02-13 | Disposition: A | Discharge status: Home / Self Care | Payer: Medicare Other | Source: Ambulatory Visit | Attending: Surgery | Admitting: Surgery

## 2019-02-13 ENCOUNTER — Other Ambulatory Visit: Payer: Self-pay

## 2019-02-13 ENCOUNTER — Encounter: Payer: Self-pay | Admitting: Surgery

## 2019-02-13 ENCOUNTER — Ambulatory Visit
Admission: RE | Admit: 2019-02-13 | Discharge: 2019-02-13 | Disposition: A | Discharge status: Home / Self Care | Payer: Medicare Other | Attending: Surgery | Admitting: Surgery

## 2019-02-13 ENCOUNTER — Encounter
Admission: RE | Disposition: A | Discharge status: Home / Self Care | Payer: Self-pay | Source: Home / Self Care | Attending: Surgery

## 2019-02-13 DIAGNOSIS — Z8619 Personal history of other infectious and parasitic diseases: Secondary | ICD-10-CM | POA: Diagnosis not present

## 2019-02-13 DIAGNOSIS — I10 Essential (primary) hypertension: Secondary | ICD-10-CM | POA: Diagnosis not present

## 2019-02-13 DIAGNOSIS — K219 Gastro-esophageal reflux disease without esophagitis: Secondary | ICD-10-CM | POA: Diagnosis not present

## 2019-02-13 DIAGNOSIS — Z79899 Other long term (current) drug therapy: Secondary | ICD-10-CM | POA: Insufficient documentation

## 2019-02-13 DIAGNOSIS — D0511 Intraductal carcinoma in situ of right breast: Secondary | ICD-10-CM | POA: Diagnosis present

## 2019-02-13 DIAGNOSIS — Z803 Family history of malignant neoplasm of breast: Secondary | ICD-10-CM | POA: Diagnosis not present

## 2019-02-13 DIAGNOSIS — Z79890 Hormone replacement therapy (postmenopausal): Secondary | ICD-10-CM | POA: Diagnosis not present

## 2019-02-13 DIAGNOSIS — D0590 Unspecified type of carcinoma in situ of unspecified breast: Secondary | ICD-10-CM

## 2019-02-13 DIAGNOSIS — E039 Hypothyroidism, unspecified: Secondary | ICD-10-CM | POA: Diagnosis not present

## 2019-02-13 HISTORY — PX: PARTIAL MASTECTOMY WITH AXILLARY SENTINEL LYMPH NODE BIOPSY: SHX6004

## 2019-02-13 HISTORY — PX: BREAST LUMPECTOMY: SHX2

## 2019-02-13 SURGERY — PARTIAL MASTECTOMY WITH AXILLARY SENTINEL LYMPH NODE BIOPSY
Anesthesia: General | Site: Breast | Laterality: Right

## 2019-02-13 MED ORDER — HYDROCODONE-ACETAMINOPHEN 7.5-325 MG PO TABS
1.0000 | ORAL_TABLET | Freq: Once | ORAL | Status: DC | PRN
  Filled 2019-02-13: qty 1

## 2019-02-13 MED ORDER — PROPOFOL 10 MG/ML IV BOLUS
INTRAVENOUS | Status: DC | PRN
  Administered 2019-02-13: 100 mg via INTRAVENOUS

## 2019-02-13 MED ORDER — DEXAMETHASONE SODIUM PHOSPHATE 10 MG/ML IJ SOLN
INTRAMUSCULAR | Status: DC | PRN
  Administered 2019-02-13: 6 mg via INTRAVENOUS

## 2019-02-13 MED ORDER — LIDOCAINE HCL (PF) 1 % IJ SOLN
INTRAMUSCULAR | Status: AC
  Filled 2019-02-13: qty 30

## 2019-02-13 MED ORDER — ACETAMINOPHEN 500 MG PO TABS: 1000.0000 mg | ORAL_TABLET | ORAL | Status: AC

## 2019-02-13 MED ORDER — FENTANYL CITRATE (PF) 100 MCG/2ML IJ SOLN: 25.0000 ug | INTRAMUSCULAR | Status: DC | PRN

## 2019-02-13 MED ORDER — MEPERIDINE HCL 50 MG/ML IJ SOLN: 6.2500 mg | INTRAMUSCULAR | Status: DC | PRN

## 2019-02-13 MED ORDER — HYDROCODONE-ACETAMINOPHEN 5-325 MG PO TABS: 1.0000 | ORAL_TABLET | Freq: Four times a day (QID) | ORAL | 0 refills | Status: DC | PRN

## 2019-02-13 MED ORDER — FENTANYL CITRATE (PF) 100 MCG/2ML IJ SOLN
INTRAMUSCULAR | Status: DC | PRN
  Administered 2019-02-13: 12.5 ug via INTRAVENOUS

## 2019-02-13 MED ORDER — LACTATED RINGERS IV SOLN: INTRAVENOUS | Status: DC

## 2019-02-13 MED ORDER — FENTANYL CITRATE (PF) 100 MCG/2ML IJ SOLN
INTRAMUSCULAR | Status: AC
  Filled 2019-02-13: qty 2

## 2019-02-13 MED ORDER — ONDANSETRON HCL 4 MG/2ML IJ SOLN
INTRAMUSCULAR | Status: AC
  Filled 2019-02-13: qty 2

## 2019-02-13 MED ORDER — ACETAMINOPHEN 500 MG PO TABS
ORAL_TABLET | ORAL | Status: AC
  Administered 2019-02-13: 1000 mg via ORAL
  Filled 2019-02-13: qty 2

## 2019-02-13 MED ORDER — PROMETHAZINE HCL 25 MG/ML IJ SOLN: 6.2500 mg | INTRAMUSCULAR | Status: DC | PRN

## 2019-02-13 MED ORDER — CELECOXIB 200 MG PO CAPS
ORAL_CAPSULE | ORAL | Status: AC
  Administered 2019-02-13: 200 mg via ORAL
  Filled 2019-02-13: qty 1

## 2019-02-13 MED ORDER — DEXAMETHASONE SODIUM PHOSPHATE 10 MG/ML IJ SOLN
INTRAMUSCULAR | Status: AC
  Filled 2019-02-13: qty 1

## 2019-02-13 MED ORDER — PROPOFOL 10 MG/ML IV BOLUS
INTRAVENOUS | Status: AC
  Filled 2019-02-13: qty 20

## 2019-02-13 MED ORDER — IBUPROFEN 800 MG PO TABS: 800.0000 mg | ORAL_TABLET | Freq: Three times a day (TID) | ORAL | 0 refills | Status: DC | PRN

## 2019-02-13 MED ORDER — FENTANYL CITRATE (PF) 100 MCG/2ML IJ SOLN
INTRAMUSCULAR | Status: AC
  Administered 2019-02-13: 25 ug via INTRAVENOUS
  Filled 2019-02-13: qty 2

## 2019-02-13 MED ORDER — GABAPENTIN 300 MG PO CAPS: 300.0000 mg | ORAL_CAPSULE | ORAL | Status: AC

## 2019-02-13 MED ORDER — BUPIVACAINE HCL (PF) 0.5 % IJ SOLN
INTRAMUSCULAR | Status: AC
  Filled 2019-02-13: qty 30

## 2019-02-13 MED ORDER — LIDOCAINE HCL 1 % IJ SOLN
INTRAMUSCULAR | Status: DC | PRN
  Administered 2019-02-13: 13.5 mL

## 2019-02-13 MED ORDER — ACETAMINOPHEN 325 MG PO TABS: 650.0000 mg | ORAL_TABLET | Freq: Three times a day (TID) | ORAL | 0 refills | Status: AC | PRN

## 2019-02-13 MED ORDER — EPINEPHRINE PF 1 MG/ML IJ SOLN
INTRAMUSCULAR | Status: AC
  Filled 2019-02-13: qty 1

## 2019-02-13 MED ORDER — ONDANSETRON HCL 4 MG/2ML IJ SOLN
INTRAMUSCULAR | Status: DC | PRN
  Administered 2019-02-13: 4 mg via INTRAVENOUS

## 2019-02-13 MED ORDER — LIDOCAINE HCL (CARDIAC) PF 100 MG/5ML IV SOSY
PREFILLED_SYRINGE | INTRAVENOUS | Status: DC | PRN
  Administered 2019-02-13: 50 mg via INTRAVENOUS

## 2019-02-13 MED ORDER — DOCUSATE SODIUM 100 MG PO CAPS: 100.0000 mg | ORAL_CAPSULE | Freq: Two times a day (BID) | ORAL | 0 refills | Status: AC | PRN

## 2019-02-13 MED ORDER — ACETAMINOPHEN 160 MG/5ML PO SOLN
325.0000 mg | ORAL | Status: DC | PRN
  Filled 2019-02-13: qty 10.2

## 2019-02-13 MED ORDER — TECHNETIUM TC 99M SULFUR COLLOID FILTERED
1.0000 | Freq: Once | INTRAVENOUS | Status: AC | PRN
  Administered 2019-02-13: 11:00:00 0.819 via INTRADERMAL

## 2019-02-13 MED ORDER — GABAPENTIN 300 MG PO CAPS
ORAL_CAPSULE | ORAL | Status: AC
  Administered 2019-02-13: 300 mg via ORAL
  Filled 2019-02-13: qty 1

## 2019-02-13 MED ORDER — CHLORHEXIDINE GLUCONATE CLOTH 2 % EX PADS: 6.0000 | MEDICATED_PAD | Freq: Once | CUTANEOUS | Status: DC

## 2019-02-13 MED ORDER — CELECOXIB 200 MG PO CAPS: 200.0000 mg | ORAL_CAPSULE | ORAL | Status: AC

## 2019-02-13 MED ORDER — ACETAMINOPHEN 325 MG PO TABS: 325.0000 mg | ORAL_TABLET | ORAL | Status: DC | PRN

## 2019-02-13 MED ORDER — BUPIVACAINE-EPINEPHRINE 0.5% -1:200000 IJ SOLN
INTRAMUSCULAR | Status: DC | PRN
  Administered 2019-02-13: 13.5 mL

## 2019-02-13 MED ORDER — EPHEDRINE SULFATE 50 MG/ML IJ SOLN
INTRAMUSCULAR | Status: DC | PRN
  Administered 2019-02-13: 10 mg via INTRAVENOUS

## 2019-02-13 SURGICAL SUPPLY — 43 items
APPLIER CLIP 11 MED OPEN (CLIP)
BLADE SURG 15 STRL LF DISP TIS (BLADE) ×1 IMPLANT
BLADE SURG 15 STRL SS (BLADE) ×2
CANISTER SUCT 1200ML W/VALVE (MISCELLANEOUS) ×3 IMPLANT
CHLORAPREP W/TINT 26 (MISCELLANEOUS) ×3 IMPLANT
CLIP APPLIE 11 MED OPEN (CLIP) IMPLANT
CNTNR SPEC 2.5X3XGRAD LEK (MISCELLANEOUS) ×2
CONT SPEC 4OZ STER OR WHT (MISCELLANEOUS) ×4
CONTAINER SPEC 2.5X3XGRAD LEK (MISCELLANEOUS) ×2 IMPLANT
COVER WAND RF STERILE (DRAPES) ×3 IMPLANT
DERMABOND ADVANCED (GAUZE/BANDAGES/DRESSINGS) ×2
DERMABOND ADVANCED .7 DNX12 (GAUZE/BANDAGES/DRESSINGS) ×1 IMPLANT
DEVICE DUBIN SPECIMEN MAMMOGRA (MISCELLANEOUS) ×3 IMPLANT
DRAPE LAPAROTOMY TRNSV 106X77 (MISCELLANEOUS) ×3 IMPLANT
ELECT CAUTERY BLADE TIP 2.5 (TIP) ×3
ELECT REM PT RETURN 9FT ADLT (ELECTROSURGICAL) ×3
ELECTRODE CAUTERY BLDE TIP 2.5 (TIP) ×1 IMPLANT
ELECTRODE REM PT RTRN 9FT ADLT (ELECTROSURGICAL) ×1 IMPLANT
GLOVE BIOGEL PI IND STRL 7.0 (GLOVE) ×1 IMPLANT
GLOVE BIOGEL PI INDICATOR 7.0 (GLOVE) ×2
GLOVE SURG SYN 6.5 ES PF (GLOVE) ×6 IMPLANT
GLOVE SURG SYN 6.5 PF PI (GLOVE) ×2 IMPLANT
GOWN STRL REUS W/ TWL LRG LVL3 (GOWN DISPOSABLE) ×3 IMPLANT
GOWN STRL REUS W/TWL LRG LVL3 (GOWN DISPOSABLE) ×6
JACKSON PRATT 10 (INSTRUMENTS) IMPLANT
KIT MARKER MARGIN INK (KITS) ×3 IMPLANT
KIT TURNOVER KIT A (KITS) ×3 IMPLANT
LABEL OR SOLS (LABEL) ×3 IMPLANT
LIGHT WAVEGUIDE WIDE FLAT (MISCELLANEOUS) IMPLANT
MARGIN MAP 10MM (MISCELLANEOUS) ×2 IMPLANT
NEEDLE HYPO 22GX1.5 SAFETY (NEEDLE) ×6 IMPLANT
PACK BASIN MINOR ARMC (MISCELLANEOUS) ×3 IMPLANT
SLEVE PROBE SENORX GAMMA FIND (MISCELLANEOUS) ×3 IMPLANT
SUT MNCRL 4-0 (SUTURE) ×4
SUT MNCRL 4-0 27XMFL (SUTURE) ×2
SUT SILK 2 0 SH (SUTURE) ×3 IMPLANT
SUT SILK 3 0 12 30 (SUTURE) ×3 IMPLANT
SUT VIC AB 3-0 SH 27 (SUTURE) ×4
SUT VIC AB 3-0 SH 27X BRD (SUTURE) ×2 IMPLANT
SUTURE MNCRL 4-0 27XMF (SUTURE) ×2 IMPLANT
SYR 20ML LL LF (SYRINGE) ×3 IMPLANT
SYR BULB IRRIG 60ML STRL (SYRINGE) ×3 IMPLANT
WATER STERILE IRR 1000ML POUR (IV SOLUTION) ×3 IMPLANT

## 2019-02-13 NOTE — Anesthesia Post-op Follow-up Note (Signed)
Anesthesia QCDR form completed.        

## 2019-02-13 NOTE — Discharge Instructions (Signed)
AMBULATORY SURGERY  DISCHARGE INSTRUCTIONS   1) The drugs that you were given will stay in your system until tomorrow so for the next 24 hours you should not:  A) Drive an automobile B) Make any legal decisions C) Drink any alcoholic beverage   2) You may resume regular meals tomorrow.  Today it is better to start with liquids and gradually work up to solid foods.  You may eat anything you prefer, but it is better to start with liquids, then soup and crackers, and gradually work up to solid foods.   3) Please notify your doctor immediately if you have any unusual bleeding, trouble breathing, redness and pain at the surgery site, drainage, fever, or pain not relieved by medication.    4) Additional Instructions:        Please contact your physician with any problems or Same Day Surgery at [phone], Monday through Friday 6 am to 4 pm, or [place] [facility] number at [phone].    Lumpectomy and lymph node biopsy, Care After This sheet gives you information about how to care for yourself after your procedure. Your health care provider may also give you more specific instructions. If you have problems or questions, contact your health care provider. What can I expect after the procedure? After the procedure, it is common to have:  Soreness.  Bruising.  Itching. Follow these instructions at home: site care Follow instructions from your health care provider about how to take care of your site. Make sure you:  Wash your hands with soap and water before and after you change your bandage (dressing). If soap and water are not available, use hand sanitizer.  Leave stitches (sutures), skin glue, or adhesive strips in place. These skin closures may need to stay in place for 2 weeks or longer. If adhesive strip edges start to loosen and curl up, you may trim the loose edges. Do not remove adhesive strips completely unless your health care provider tells you to do  that.  If the area bleeds or bruises, apply gentle pressure for 10 minutes.  OK TO SHOWER IN 24HRS  Check your site every day for signs of infection. Check for:  Redness, swelling, or pain.  Fluid or blood.  Warmth.  Pus or a bad smell.  General instructions  Rest and then return to your normal activities as told by your health care provider. .  tylenol and advil as needed for discomfort.  Please alternate between the two every four hours as needed for pain.   .  Use narcotics, if prescribed, only when tylenol and motrin is not enough to control pain. .  325-650mg  every 8hrs to max of 3000mg /24hrs (including the 325mg  in every norco dose) for the tylenol.   .  Advil up to 800mg  per dose every 8hrs as needed for pain.    Keep all follow-up visits as told by your health care provider. This is important. Contact a health care provider if:  You have redness, swelling, or pain around your site.  You have fluid or blood coming from your site.  Your site feels warm to the touch.  You have pus or a bad smell coming from your site.  You have a fever.  Your sutures, skin glue, or adhesive strips loosen or come off sooner than expected. Get help right away if:  You have bleeding that does not stop with pressure or a dressing. Summary  After the procedure, it is common to have  some soreness, bruising, and itching at the site.  Follow instructions from your health care provider about how to take care of your site.  Check your site every day for signs of infection.  Contact a health care provider if you have redness, swelling, or pain around your site, or your site feels warm to the touch.  Keep all follow-up visits as told by your health care provider. This is important. This information is not intended to replace advice given to you by your health care provider. Make sure you discuss any questions you have with your health care provider. Document Released: 03/12/2015 Document  Revised: 08/13/2017 Document Reviewed: 08/13/2017 Elsevier Interactive Patient Education  [employer].

## 2019-02-13 NOTE — Op Note (Signed)
Preoperative diagnosis: Right breast DCIS Postoperative diagnosis: Same.   Procedure: needle-localized right breast partial mastectomy.                      Right axillary Sentinel Lymph node biopsy  Anesthesia: GETA  Surgeon: Dr. Benjamine Sprague  Wound Classification: Clean  Indications: Patient is a 78 y.o. female with a nonpalpable right breast mass noted on mammography with core biopsy demonstrating DCIS requires needle-localized partial mastectomy for treatment with sentinel lymph node biopsy due to location being right upper quadrant causing possible issues with future biopsies.  Specimen: Right breast mass, Sentinel Lymph nodes x 2  Complications: None  Estimated Blood Loss: 15 mL  Findings: 1. Specimen mammography shows marker and wire on specimen 2. Pathology call refers gross examination of margins was negative 3. No other palpable mass or lymph node identified.   Description of procedure: Preoperative needle localization was performed by radiology. In the nuclear medicine suite, the subareolar region was injected with Tc-99 sulfur colloid. Localization studies were reviewed. The patient was taken to the operating room and placed supine on the operating table, and after general anesthesia the right breast and axilla were prepped and draped in the usual sterile fashion. A time-out was completed verifying correct patient, procedure, site, positioning, and implant(s) and/or special equipment prior to beginning this procedure.  By comparing the localization studies with the direction and skin entry site of the needle, the probable trajectory and location of the mass was visualized. A skin incision was planned in such a way as to minimize the amount of dissection to reach the mass.  The skin incision was made after infusion of local. Flaps were raised and the location of the wire confirmed. The wire was delivered into the wound. Sharp and blunt dissection was then taken down to the mass,  taking care to include the entire localizing needle and a margin of grossly normal tissue. The specimen and entire localizing wire were removed. The specimen was oriented with paint and radiopaque markers, sent to radiology with the localization studies. Confirmation was received that the entire target lesion had been resected. A hand-held gamma probe was used to identify the location of the hottest spot in the axilla. An incision was made around the caudal axillary hairline. Sharp and blunt Dissection was carried down to subdermal facias. The probe was placed within wound and again, the point of maximal count was found. Dissection continue until nodule was identified. The probe was placed in contact with the node and 900 counts were recorded. The node was excised in its entirety. Ex vivo, the node measured approximately 900 counts when placed on the probe. The bed of the node measured less than 100 counts. An additional hot spot was detected and the node was excised in similar fashion. No additional hot spots were identified. No clinically abnormal nodes were palpated. Both wounds irrigated, hemostasis was achieved and the wound closed in layers with  interrupted sutures of 3-0 Vicryl in deep dermal layer and a running subcuticular suture of Monocryl 4-0, then dressed with dermabond. The patient tolerated the procedure well and was taken to the postanesthesia care unit in stable condition. Sponge and instrument count correct at end of procedure.

## 2019-02-13 NOTE — Interval H&P Note (Signed)
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  The various methods of treatment have been discussed with the patient and family. After consideration of risks, benefits and other options for treatment, the patient has consented to  Procedure(s): PARTIAL MASTECTOMY WITH AXILLARY SENTINEL LYMPH NODE BIOPSY (Right) as a surgical intervention.  The patient's history has been reviewed, patient examined, no change in status, stable for surgery.  I have reviewed the patient's chart and labs.  Questions were answered to the patient's satisfaction.     Andre Swander Lysle Pearl

## 2019-02-13 NOTE — Anesthesia Procedure Notes (Signed)
Procedure Name: LMA Insertion Date/Time: 02/13/2019 2:55 PM Performed by: Lily Peer, Summer, RN Pre-anesthesia Checklist: Patient identified, Patient being monitored, Emergency Drugs available and Suction available Patient Re-evaluated:Patient Re-evaluated prior to induction Oxygen Delivery Method: Circle system utilized Preoxygenation: Pre-oxygenation with 100% oxygen Induction Type: IV induction Ventilation: Mask ventilation without difficulty LMA: LMA inserted LMA Size: 3.0 Tube type: Oral Number of attempts: 2 Placement Confirmation: positive ETCO2 and breath sounds checked- equal and bilateral Tube secured with: Tape Dental Injury: Teeth and Oropharynx as per pre-operative assessment

## 2019-02-13 NOTE — Anesthesia Preprocedure Evaluation (Addendum)
Anesthesia Evaluation  Patient identified by MRN, date of birth, ID band Patient awake    Reviewed: Allergy & Precautions, H&P , NPO status , reviewed documented beta blocker date and time   Airway Mallampati: II  TM Distance: >3 FB Neck ROM: full    Dental  (+) Teeth Intact Minor chipping:   Pulmonary pneumonia, resolved,    Pulmonary exam normal        Cardiovascular hypertension, Normal cardiovascular exam     Neuro/Psych PSYCHIATRIC DISORDERS Depression    GI/Hepatic GERD  Controlled and Medicated,  Endo/Other  Hypothyroidism   Renal/GU      Musculoskeletal   Abdominal   Peds  Hematology   Anesthesia Other Findings Past Medical History: No date: Cancer ([place]) 09/2018: COVID-19 No date: GERD (gastroesophageal reflux disease) No date: Hyperlipidemia No date: Hypertension No date: Hypothyroidism No date: Lumbar disc disease No date: MVP (mitral valve prolapse) 09/2018: Pneumonia     Comment:  pt states she was positive for covid then as well Past Surgical History: No date: ABDOMINAL HYSTERECTOMY 01/08/2019: BREAST BIOPSY; Right     Comment:  Affirm Biopsy- X-Clip- Path pending 06/02/2015: CARDIAC CATHETERIZATION; N/A     Comment:  Procedure: Left Heart Cath and Coronary Angiography;                Surgeon: Teodoro Spray, MD;  Location: [facility]               [facility];  Service: Cardiovascular;  Laterality: N/A; 01/27/2019: COLONOSCOPY WITH PROPOFOL; N/A     Comment:  Procedure: COLONOSCOPY WITH PROPOFOL;  Surgeon: Toledo,               Benay Pike, MD;  Location: [facility];  Service:               Gastroenterology;  Laterality: N/A; 01/27/2019: ESOPHAGOGASTRODUODENOSCOPY (EGD) WITH PROPOFOL; N/A     Comment:  Procedure: ESOPHAGOGASTRODUODENOSCOPY (EGD) WITH               PROPOFOL;  Surgeon: Toledo, Benay Pike, MD;  Location:               [facility];  Service: Gastroenterology;  Laterality:           N/A; No date: STOMACH SURGERY No date: THYROID SURGERY BMI    Body Mass Index: 20.45 kg/m     Reproductive/Obstetrics                            Anesthesia Physical Anesthesia Plan  ASA: II  Anesthesia Plan: General ETT and General LMA   Post-op Pain Management:    Induction: Intravenous  PONV Risk Score and Plan: 3 and Ondansetron, Midazolam, Dexamethasone and Treatment may vary due to age or medical condition  Airway Management Planned: LMA  Additional Equipment:   Intra-op Plan:   Post-operative Plan: Extubation in OR  Informed Consent: I have reviewed the patients History and Physical, chart, labs and discussed the procedure including the risks, benefits and alternatives for the proposed anesthesia with the patient or authorized representative who has indicated his/her understanding and acceptance.     Dental Advisory Given  Plan Discussed with: CRNA  Anesthesia Plan Comments:        Anesthesia Quick Evaluation

## 2019-02-13 NOTE — Anesthesia Procedure Notes (Addendum)
Procedure Name: LMA Insertion Date/Time: 02/13/2019 2:50 PM Performed by: Allean Found, CRNA Pre-anesthesia Checklist: Patient identified, Patient being monitored, Timeout performed, Emergency Drugs available and Suction available Patient Re-evaluated:Patient Re-evaluated prior to induction Oxygen Delivery Method: Circle system utilized Preoxygenation: Pre-oxygenation with 100% oxygen Induction Type: IV induction Ventilation: Mask ventilation without difficulty LMA: LMA inserted LMA Size: 3.0 Tube type: Oral Number of attempts: 1 Placement Confirmation: positive ETCO2 and breath sounds checked- equal and bilateral Tube secured with: Tape Dental Injury: Teeth and Oropharynx as per pre-operative assessment  Comments: 2 attempts per [facility].  Started with 3.5 air-q, too large, stepped down to #3 Ambu AuraStraight.

## 2019-02-13 NOTE — Transfer of Care (Signed)
Immediate Anesthesia Transfer of Care Note  Patient: Chelsea Peters  Procedure(s) Performed: PARTIAL MASTECTOMY WITH AXILLARY SENTINEL LYMPH NODE BIOPSY (Right Breast)  Patient Location: PACU  Anesthesia Type:General  Level of Consciousness: awake, alert  and oriented  Airway & Oxygen Therapy: Patient Spontanous Breathing and Patient connected to face mask oxygen  Post-op Assessment: Report given to RN and Post -op Vital signs reviewed and stable  Post vital signs: Reviewed and stable  Last Vitals:  Vitals Value Taken Time  BP 130/62 02/13/19 1618  Temp    Pulse 43 02/13/19 1620  Resp 20 02/13/19 1622  SpO2 69 % 02/13/19 1620  Vitals shown include unvalidated device data.  Last Pain:  Vitals:   02/13/19 1149  TempSrc: Oral  PainSc: 0-No pain         Complications: No apparent anesthesia complications

## 2019-02-18 LAB — SURGICAL PATHOLOGY

## 2019-02-19 NOTE — Progress Notes (Signed)
[place]  Telephone:(336) [phone] Fax:(336) [phone]  ID: Chelsea Peters OB: 05-22-1940  MR#: [id number]  QJF#:[id number]  Patient Care Team: Rusty Aus, MD as PCP - General (Internal Medicine)  CHIEF COMPLAINT: DCIS, right breast  INTERVAL HISTORY: Patient returns to clinic today for further evaluation and treatment planning.  She underwent lumpectomy on February 13, 2019 and tolerated the procedure well.  Patient had close surgical margins of 1 mm, but no invasive component was noted. She has no neurologic complaints.  She denies any recent fevers or illnesses.  She has a good appetite and denies weight loss.  She has no chest pain, shortness of breath, cough, or hemoptysis.  She denies any nausea, vomiting, constipation, or diarrhea.  She has no urinary complaints.  Patient offers no specific complaints today.  REVIEW OF SYSTEMS:   Review of Systems  Constitutional: Negative.  Negative for fever and malaise/fatigue.  Respiratory: Negative.  Negative for cough, hemoptysis and shortness of breath.   Cardiovascular: Negative.  Negative for chest pain and leg swelling.  Gastrointestinal: Negative.  Negative for abdominal pain.  Genitourinary: Negative.  Negative for dysuria.  Musculoskeletal: Negative.  Negative for back pain.  Skin: Negative.  Negative for rash.  Neurological: Negative.  Negative for dizziness, focal weakness, weakness and headaches.  Psychiatric/Behavioral: Negative.  The patient is not nervous/anxious.     As per HPI. Otherwise, a complete review of systems is negative.  PAST MEDICAL HISTORY: Past Medical History:  Diagnosis Date  . Cancer ([place])   . COVID-19 09/2018  . GERD (gastroesophageal reflux disease)   . Hyperlipidemia   . Hypertension   . Hypothyroidism   . Lumbar disc disease   . MVP (mitral valve prolapse)   . Pneumonia 09/2018   pt states she was positive for covid then as well    PAST SURGICAL HISTORY: Past  Surgical History:  Procedure Laterality Date  . ABDOMINAL HYSTERECTOMY    . BREAST BIOPSY Right 01/08/2019   Affirm Biopsy- X-Clip- DCIS  . BREAST LUMPECTOMY Right 02/13/2019  . CARDIAC CATHETERIZATION N/A 06/02/2015   Procedure: Left Heart Cath and Coronary Angiography;  Surgeon: Teodoro Spray, MD;  Location: [facility];  Service: Cardiovascular;  Laterality: N/A;  . COLONOSCOPY WITH PROPOFOL N/A 01/27/2019   Procedure: COLONOSCOPY WITH PROPOFOL;  Surgeon: Toledo, Benay Pike, MD;  Location: [facility];  Service: Gastroenterology;  Laterality: N/A;  . ESOPHAGOGASTRODUODENOSCOPY (EGD) WITH PROPOFOL N/A 01/27/2019   Procedure: ESOPHAGOGASTRODUODENOSCOPY (EGD) WITH PROPOFOL;  Surgeon: Toledo, Benay Pike, MD;  Location: [facility];  Service: Gastroenterology;  Laterality: N/A;  . PARTIAL MASTECTOMY WITH AXILLARY SENTINEL LYMPH NODE BIOPSY Right 02/13/2019   Procedure: PARTIAL MASTECTOMY WITH AXILLARY SENTINEL LYMPH NODE BIOPSY;  Surgeon: Benjamine Sprague, DO;  Location: [facility] OR[facility];  Service: General;  Laterality: Right;  . STOMACH SURGERY    . THYROID SURGERY      FAMILY HISTORY: Family History  Problem Relation Age of Onset  . Breast cancer Mother 71  . Breast cancer Maternal Aunt   . Breast cancer Maternal Aunt   . Stomach cancer Other   . Throat cancer Other     ADVANCED DIRECTIVES (Y/N):  N  HEALTH MAINTENANCE: Social History   Tobacco Use  . Smoking status: Never Smoker  . Smokeless tobacco: Never Used  Substance Use Topics  . Alcohol use: Not Currently  . Drug use: No     Colonoscopy:  PAP:  Bone density:  Lipid panel:  Allergies  Allergen Reactions  . Ambien [Zolpidem] Other (See Comments)    erratic behavior/"made me crazy"  . Latex Itching    redness  . Paxil [Paroxetine] Other (See Comments)    Stomach pains.  . Penicillins Other (See Comments)    Blisters in nose Did it involve swelling of the face/tongue/throat, SOB, or low BP? No Did it  involve sudden or severe rash/hives, skin peeling, or any reaction on the inside of your mouth or nose? Yes--blisters in nose Did you need to seek medical attention at a hospital or doctor's office? No When did it last happen?More than 10 years ago If all above answers are "NO", may proceed with cephalosporin use.     Current Outpatient Medications  Medication Sig Dispense Refill  . acetaminophen (TYLENOL) 325 MG tablet Take 2 tablets (650 mg total) by mouth every 8 (eight) hours as needed for mild pain. 40 tablet 0  . ALPRAZolam (XANAX) 0.25 MG tablet Take 0.25 mg by mouth daily as needed for anxiety.    Marland Kitchen amLODipine (NORVASC) 5 MG tablet Take 5 mg by mouth every morning.   3  . Biotin 1 MG CAPS Take 1 mg by mouth daily.    . Cholecalciferol (D 2000) 2000 units TABS Take 2,000 Units by mouth daily.    . DULoxetine (CYMBALTA) 30 MG capsule Take 30 mg by mouth daily. Reported on 06/02/2015  0  . estradiol (ESTRACE) 1 MG tablet Take 0.5 mg by mouth daily.    Marland Kitchen FLUoxetine (PROZAC) 20 MG capsule Take 20 mg by mouth daily.    Marland Kitchen HYDROcodone-acetaminophen (NORCO) 5-325 MG tablet Take 1 tablet by mouth every 6 (six) hours as needed for up to 6 doses for moderate pain. 6 tablet 0  . ibuprofen (ADVIL) 800 MG tablet Take 1 tablet (800 mg total) by mouth every 8 (eight) hours as needed for mild pain or moderate pain. 30 tablet 0  . levothyroxine (SYNTHROID, LEVOTHROID) 25 MCG tablet Take 25 mcg by mouth daily before breakfast.     . Multiple Vitamin (MULTIVITAMIN WITH MINERALS) TABS tablet Take 1 tablet by mouth daily.    Marland Kitchen omeprazole (PRILOSEC) 20 MG capsule Take 20 mg by mouth every morning.   0   No current facility-administered medications for this visit.    OBJECTIVE: Vitals:   02/24/19 1405  BP: 135/71  Pulse: 62  Temp: 97.7 F (36.5 C)     Body mass index is 20.45 kg/m.    ECOG FS:0 - Asymptomatic  General: Well-developed, well-nourished, no acute distress. Eyes: Pink conjunctiva,  anicteric sclera. HEENT: Normocephalic, moist mucous membranes. Lungs: No audible wheezing or coughing. Heart: Regular rate and rhythm. Abdomen: Soft, nontender, no obvious distention. Musculoskeletal: No edema, cyanosis, or clubbing. Neuro: Alert, answering all questions appropriately. Cranial nerves grossly intact. Skin: No rashes or petechiae noted. Psych: Normal affect.   LAB RESULTS:  Lab Results  Component Value Date   NA 136 10/05/2018   K 3.8 10/05/2018   CL 100 10/05/2018   CO2 27 10/05/2018   GLUCOSE 88 10/05/2018   BUN 25 (H) 10/05/2018   CREATININE 0.79 10/05/2018   CALCIUM 9.0 10/05/2018   PROT 7.1 05/19/2015   ALBUMIN 4.1 05/19/2015   AST 31 05/19/2015   ALT 17 05/19/2015   ALKPHOS 61 05/19/2015   BILITOT 0.6 05/19/2015   GFRNONAA >60 10/05/2018   GFRAA >60 10/05/2018    Lab Results  Component Value Date   WBC 16.7 (H) 10/05/2018   NEUTROABS  2.9 05/19/2015   HGB 14.4 10/05/2018   HCT 42.0 10/05/2018   MCV 92.1 10/05/2018   PLT 323 10/05/2018     STUDIES: NM SENTINEL NODE INJECTION  Result Date: 02/13/2019 CLINICAL DATA:  Right-sided breast cancer. EXAM: NUCLEAR MEDICINE BREAST LYMPHOSCINTIGRAPHY TECHNIQUE: Intradermal injection of radiopharmaceutical was performed at the 12 o'clock, 3 o'clock, 6 o'clock, and 9 o'clock positions around the right nipple. The patient was then sent to the operating room where the sentinel node(s) were identified and removed by the surgeon. RADIOPHARMACEUTICALS:  Total of 1 mCi Millipore-filtered Technetium-3msulfur colloid, injected in four aliquots of 0.25 mCi each. IMPRESSION: Uncomplicated intradermal injection of a total of 1 mCi Technetium-910mulfur colloid for purposes of sentinel node identification. Electronically Signed   By: JoSandi Mariscal.D.   On: 02/13/2019 12:16   MM Breast Surgical Specimen  Result Date: 02/13/2019 CLINICAL DATA:  Patient status post right breast lumpectomy. EXAM: SPECIMEN RADIOGRAPH OF  THE RIGHT BREAST COMPARISON:  Previous exam(s). FINDINGS: Status post excision of the right breast. The wire tip and biopsy marker clip are present and are marked for pathology. IMPRESSION: Specimen radiograph of the right breast. Electronically Signed   By: DrLovey Newcomer.D.   On: 02/13/2019 15:41   MM RT PLC BREAST LOC DEV   1ST LESION  INC MAMMO GUIDE  Result Date: 02/13/2019 CLINICAL DATA:  Patient for preoperative localization prior to right breast lumpectomy. EXAM: NEEDLE LOCALIZATION OF THE RIGHT BREAST WITH MAMMO GUIDANCE COMPARISON:  Previous exams. FINDINGS: Patient presents for needle localization prior to right breast lumpectomy. I met with the patient and we discussed the procedure of needle localization including benefits and alternatives. We discussed the high likelihood of a successful procedure. We discussed the risks of the procedure, including infection, bleeding, tissue injury, and further surgery. Informed, written consent was given. The usual time-out protocol was performed immediately prior to the procedure. Using mammographic guidance, sterile technique, 1% lidocaine and a 7 cm modified Kopans needle, X shaped clip was localized using cranial approach. The images were marked for Dr. SaLysle PearlIMPRESSION: Needle localization right breast. No apparent complications. Electronically Signed   By: DrLovey Newcomer.D.   On: 02/13/2019 14:41    ASSESSMENT: DCIS, right breast  PLAN:    1. DCIS, right breast: Patient underwent lumpectomy on February 13, 2019.  Although patient had close margins of 1 mm, is determined that reexcision is not necessary to proceed directly with adjuvant XRT.  Patient had consultation with radiation oncology earlier today.  Because there was no invasive component seen, she does not require adjuvant chemotherapy.  Patient will benefit from tamoxifen for total 5 years.  She was given a prescription today and instructed not to initiate treatment until XRT has been  completed.  Return to clinic in 3 months after the conclusion of XRT for further evaluation and continuation of tamoxifen.    I spent a total of 25 minutes face-to-face with the patient and reviewing chart data of which greater than 50% of the visit was spent in counseling and coordination of care as detailed above.   Patient expressed understanding and was in agreement with this plan. She also understands that She can call clinic at any time with any questions, concerns, or complaints.   Cancer Staging Ductal carcinoma in situ (DCIS) of right breast Staging form: Breast, AJCC 8th Edition - Clinical stage from 01/16/2019: Stage 0 (cTis (DCIS), cN0, cM0, ER+, PR+, HER2-) - Signed by FiLloyd HugerMD  on 01/16/2019   Lloyd Huger, MD   02/25/2019 6:34 AM

## 2019-02-24 ENCOUNTER — Other Ambulatory Visit: Payer: Self-pay

## 2019-02-24 ENCOUNTER — Inpatient Hospital Stay: Payer: Medicare Other | Attending: Oncology | Admitting: Oncology

## 2019-02-24 ENCOUNTER — Encounter: Payer: Self-pay | Admitting: Radiation Oncology

## 2019-02-24 ENCOUNTER — Ambulatory Visit
Admission: RE | Admit: 2019-02-24 | Discharge: 2019-02-24 | Disposition: A | Discharge status: Home / Self Care | Payer: Medicare Other | Source: Ambulatory Visit | Attending: Radiation Oncology | Admitting: Radiation Oncology

## 2019-02-24 VITALS — BP 135/71 | HR 62 | Temp 97.7°F | Wt 110.0 lb

## 2019-02-24 VITALS — BP 122/71 | HR 64 | Temp 97.0°F | Resp 18 | Wt 110.8 lb

## 2019-02-24 DIAGNOSIS — F419 Anxiety disorder, unspecified: Secondary | ICD-10-CM | POA: Insufficient documentation

## 2019-02-24 DIAGNOSIS — Z791 Long term (current) use of non-steroidal anti-inflammatories (NSAID): Secondary | ICD-10-CM | POA: Insufficient documentation

## 2019-02-24 DIAGNOSIS — Z79899 Other long term (current) drug therapy: Secondary | ICD-10-CM | POA: Diagnosis not present

## 2019-02-24 DIAGNOSIS — K219 Gastro-esophageal reflux disease without esophagitis: Secondary | ICD-10-CM | POA: Insufficient documentation

## 2019-02-24 DIAGNOSIS — I10 Essential (primary) hypertension: Secondary | ICD-10-CM | POA: Insufficient documentation

## 2019-02-24 DIAGNOSIS — Z170 Estrogen receptor positive status [ER+]: Secondary | ICD-10-CM | POA: Diagnosis not present

## 2019-02-24 DIAGNOSIS — I341 Nonrheumatic mitral (valve) prolapse: Secondary | ICD-10-CM | POA: Insufficient documentation

## 2019-02-24 DIAGNOSIS — D0511 Intraductal carcinoma in situ of right breast: Secondary | ICD-10-CM | POA: Diagnosis not present

## 2019-02-24 DIAGNOSIS — E785 Hyperlipidemia, unspecified: Secondary | ICD-10-CM | POA: Insufficient documentation

## 2019-02-24 DIAGNOSIS — Z803 Family history of malignant neoplasm of breast: Secondary | ICD-10-CM | POA: Insufficient documentation

## 2019-02-24 DIAGNOSIS — E039 Hypothyroidism, unspecified: Secondary | ICD-10-CM | POA: Insufficient documentation

## 2019-02-24 NOTE — Consult Note (Signed)
NEW PATIENT EVALUATION  Name: Chelsea Peters  MRN: [id number]  Date:   02/24/2019     DOB: 1940-04-19   This 78 y.o. 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  REFERRING PHYSICIAN: Rusty Aus, MD  CHIEF COMPLAINT:  Chief Complaint  Patient presents with  . Breast Cancer    Consult    DIAGNOSIS: The encounter diagnosis was Ductal carcinoma in situ (DCIS) of right breast.   PREVIOUS INVESTIGATIONS:  Mammogram and ultrasound reviewed Pathology report reviewed Clinical notes reviewed  HPI: Patient is a 78 year old female who presented with an abnormal mammogram of her right breast showing loosely grouped calcifications in the upper outer quadrant measuring 10 mm in greatest dimension.  She underwent ultrasound-guided biopsy which was positive for high-grade comedo type ductal carcinoma in situ.  This prompted a wide local excision showing a 4 mm area of ductal carcinoma in situ grade 2 margins clear but close at 1 mm.  3 regional lymph nodes were negative for metastatic disease.  Tumor was ER positive.  She has done well postoperatively and specifically denies breast tenderness cough or bone pain.  She is now referred to radiation oncology for opinion.  PLANNED TREATMENT REGIMEN: Right whole breast radiation  PAST MEDICAL HISTORY:  has a past medical history of Cancer ([place]), COVID-19 (09/2018), GERD (gastroesophageal reflux disease), Hyperlipidemia, Hypertension, Hypothyroidism, Lumbar disc disease, MVP (mitral valve prolapse), and Pneumonia (09/2018).    PAST SURGICAL HISTORY:  Past Surgical History:  Procedure Laterality Date  . ABDOMINAL HYSTERECTOMY    . BREAST BIOPSY Right 01/08/2019   Affirm Biopsy- X-Clip- DCIS  . BREAST LUMPECTOMY Right 02/13/2019  . CARDIAC CATHETERIZATION N/A 06/02/2015   Procedure: Left Heart Cath and Coronary Angiography;  Surgeon:  Teodoro Spray, MD;  Location: [place] [facility];  Service: Cardiovascular;  Laterality: N/A;  . COLONOSCOPY WITH PROPOFOL N/A 01/27/2019   Procedure: COLONOSCOPY WITH PROPOFOL;  Surgeon: Toledo, Benay Pike, MD;  Location: [facility];  Service: Gastroenterology;  Laterality: N/A;  . ESOPHAGOGASTRODUODENOSCOPY (EGD) WITH PROPOFOL N/A 01/27/2019   Procedure: ESOPHAGOGASTRODUODENOSCOPY (EGD) WITH PROPOFOL;  Surgeon: Toledo, Benay Pike, MD;  Location: [facility];  Service: Gastroenterology;  Laterality: N/A;  . PARTIAL MASTECTOMY WITH AXILLARY SENTINEL LYMPH NODE BIOPSY Right 02/13/2019   Procedure: PARTIAL MASTECTOMY WITH AXILLARY SENTINEL LYMPH NODE BIOPSY;  Surgeon: Benjamine Sprague, DO;  Location: [facility];  Service: General;  Laterality: Right;  . STOMACH SURGERY    . THYROID SURGERY      FAMILY HISTORY: family history includes Breast cancer in her maternal aunt and maternal aunt; Breast cancer (age of onset: 43) in her mother; Stomach cancer in an other family member; Throat cancer in an other family member.  SOCIAL HISTORY:  reports that she has never smoked. She has never used smokeless tobacco. She reports previous alcohol use. She reports that she does not use drugs.  ALLERGIES: Ambien [zolpidem], Latex, Paxil [paroxetine], and Penicillins  MEDICATIONS:  Current Outpatient Medications  Medication Sig Dispense Refill  . acetaminophen (TYLENOL) 325 MG tablet Take 2 tablets (650 mg total) by mouth every 8 (eight) hours as needed for mild pain. 40 tablet 0  . ALPRAZolam (XANAX) 0.25 MG tablet Take 0.25 mg by mouth daily as needed for anxiety.    Marland Kitchen amLODipine (NORVASC) 5 MG tablet Take 5 mg by mouth every morning.   3  . Biotin  1 MG CAPS Take 1 mg by mouth daily.    . Cholecalciferol (D 2000) 2000 units TABS Take 2,000 Units by mouth daily.    . DULoxetine (CYMBALTA) 30 MG capsule Take 30 mg by mouth daily. Reported on 06/02/2015  0  . estradiol (ESTRACE) 1 MG tablet Take 0.5 mg by  mouth daily.    Marland Kitchen FLUoxetine (PROZAC) 20 MG capsule Take 20 mg by mouth daily.    Marland Kitchen HYDROcodone-acetaminophen (NORCO) 5-325 MG tablet Take 1 tablet by mouth every 6 (six) hours as needed for up to 6 doses for moderate pain. 6 tablet 0  . ibuprofen (ADVIL) 800 MG tablet Take 1 tablet (800 mg total) by mouth every 8 (eight) hours as needed for mild pain or moderate pain. 30 tablet 0  . levothyroxine (SYNTHROID, LEVOTHROID) 25 MCG tablet Take 25 mcg by mouth daily before breakfast.     . Multiple Vitamin (MULTIVITAMIN WITH MINERALS) TABS tablet Take 1 tablet by mouth daily.    Marland Kitchen omeprazole (PRILOSEC) 20 MG capsule Take 20 mg by mouth every morning.   0   No current facility-administered medications for this encounter.    ECOG PERFORMANCE STATUS:  0 - Asymptomatic  REVIEW OF SYSTEMS: Patient denies any weight loss, fatigue, weakness, fever, chills or night sweats. Patient denies any loss of vision, blurred vision. Patient denies any ringing  of the ears or hearing loss. No irregular heartbeat. Patient denies heart murmur or history of fainting. Patient denies any chest pain or pain radiating to her upper extremities. Patient denies any shortness of breath, difficulty breathing at night, cough or hemoptysis. Patient denies any swelling in the lower legs. Patient denies any nausea vomiting, vomiting of blood, or coffee ground material in the vomitus. Patient denies any stomach pain. Patient states has had normal bowel movements no significant constipation or diarrhea. Patient denies any dysuria, hematuria or significant nocturia. Patient denies any problems walking, swelling in the joints or loss of balance. Patient denies any skin changes, loss of hair or loss of weight. Patient denies any excessive worrying or anxiety or significant depression. Patient denies any problems with insomnia. Patient denies excessive thirst, polyuria, polydipsia. Patient denies any swollen glands, patient denies easy bruising or  easy bleeding. Patient denies any recent infections, allergies or URI. Patient "s visual fields have not changed significantly in recent time.   PHYSICAL EXAM: BP 122/71   Pulse 64   Temp (!) 97 F (36.1 C)   Resp 18   Wt 110 lb 12.8 oz (50.3 kg)   BMI 20.60 kg/m  Patient's breasts are rather rather large and pendulous.  Incisions are well-healed in the right breast no dominant mass or nodularity is noted in either breast in 2 positions examined.  No axillary or supraclavicular adenopathy is identified.  Well-developed well-nourished patient in NAD. HEENT reveals PERLA, EOMI, discs not visualized.  Oral cavity is clear. No oral mucosal lesions are identified. Neck is clear without evidence of cervical or supraclavicular adenopathy. Lungs are clear to A&P. Cardiac examination is essentially unremarkable with regular rate and rhythm without murmur rub or thrill. Abdomen is benign with no organomegaly or masses noted. Motor sensory and DTR levels are equal and symmetric in the upper and lower extremities. Cranial nerves II through XII are grossly intact. Proprioception is intact. No peripheral adenopathy or edema is identified. No motor or sensory levels are noted. Crude visual fields are within normal range.  LABORATORY DATA: Pathology report reviewed    RADIOLOGY  RESULTS: Mammogram and ultrasound reviewed   IMPRESSION: Stage 0 ductal carcinoma in situ comedo type of the right breast status post wide local excision and sentinel node biopsy in 78 year old female  PLAN: At this time I have recommended adjuvant radiation therapy to her right breast.  Based on the size of her breast I do not think hypofractionated course of treatment would be advisable based on side effect profile.  I would plan on delivering 5040 cGy in 28 fractions.  Would also boost her scar another 1600 centigrade based on the close margin less then 2 mm.  Risks and benefits of treatment including skin reaction fatigue alteration  of blood counts possible inclusion of superficial lung all were discussed in detail with the patient and her husband.  Both seem to comprehend my treatment plan well.  I have personally set up and ordered CT simulation for next week.  Patient also will benefit from antiestrogen therapy after completion of treatment.  I would like to take this opportunity to thank you for allowing me to participate in the care of your patient.Noreene Filbert, MD

## 2019-02-25 MED ORDER — TAMOXIFEN CITRATE 20 MG PO TABS: 20.0000 mg | ORAL_TABLET | Freq: Every day | ORAL | 3 refills | Status: DC

## 2019-02-27 NOTE — Anesthesia Postprocedure Evaluation (Signed)
Anesthesia Post Note  Patient: Chelsea Peters  Procedure(s) Performed: PARTIAL MASTECTOMY WITH AXILLARY SENTINEL LYMPH NODE BIOPSY (Right Breast)  Patient location during evaluation: PACU Anesthesia Type: General Level of consciousness: awake and alert Pain management: pain level controlled Vital Signs Assessment: post-procedure vital signs reviewed and stable Respiratory status: spontaneous breathing, nonlabored ventilation and respiratory function stable Cardiovascular status: blood pressure returned to baseline and stable Postop Assessment: no apparent nausea or vomiting Anesthetic complications: no     Last Vitals:  Vitals:   02/13/19 1709 02/13/19 1736  BP: (!) 129/52 122/60  Pulse: 83 90  Resp: 17 18  Temp: (!) 36.3 C (!) 36.3 C  SpO2: 95%     Last Pain:  Vitals:   02/13/19 1736  TempSrc:   PainSc: 2                  Alphonsus Sias

## 2019-03-03 ENCOUNTER — Other Ambulatory Visit: Payer: Self-pay

## 2019-03-04 ENCOUNTER — Ambulatory Visit
Admission: RE | Admit: 2019-03-04 | Discharge: 2019-03-04 | Disposition: A | Discharge status: Home / Self Care | Payer: Medicare Other | Source: Ambulatory Visit | Attending: Radiation Oncology | Admitting: Radiation Oncology

## 2019-03-04 ENCOUNTER — Other Ambulatory Visit: Payer: Self-pay

## 2019-03-04 DIAGNOSIS — D0511 Intraductal carcinoma in situ of right breast: Secondary | ICD-10-CM | POA: Diagnosis present

## 2019-03-04 DIAGNOSIS — Z510 Encounter for antineoplastic radiation therapy: Secondary | ICD-10-CM | POA: Insufficient documentation

## 2019-03-05 DIAGNOSIS — Z510 Encounter for antineoplastic radiation therapy: Secondary | ICD-10-CM | POA: Diagnosis not present

## 2019-03-07 ENCOUNTER — Other Ambulatory Visit: Payer: Self-pay | Admitting: *Deleted

## 2019-03-07 DIAGNOSIS — D0511 Intraductal carcinoma in situ of right breast: Secondary | ICD-10-CM

## 2019-03-11 ENCOUNTER — Ambulatory Visit
Admission: RE | Admit: 2019-03-11 | Discharge: 2019-03-11 | Disposition: A | Discharge status: Home / Self Care | Payer: Medicare Other | Source: Ambulatory Visit | Attending: Radiation Oncology | Admitting: Radiation Oncology

## 2019-03-11 ENCOUNTER — Other Ambulatory Visit: Payer: Self-pay

## 2019-03-11 DIAGNOSIS — Z510 Encounter for antineoplastic radiation therapy: Secondary | ICD-10-CM | POA: Diagnosis not present

## 2019-03-12 ENCOUNTER — Other Ambulatory Visit: Payer: Self-pay

## 2019-03-12 ENCOUNTER — Ambulatory Visit
Admission: RE | Admit: 2019-03-12 | Discharge: 2019-03-12 | Disposition: A | Discharge status: Home / Self Care | Payer: Medicare Other | Source: Ambulatory Visit | Attending: Radiation Oncology | Admitting: Radiation Oncology

## 2019-03-12 DIAGNOSIS — Z510 Encounter for antineoplastic radiation therapy: Secondary | ICD-10-CM | POA: Diagnosis not present

## 2019-03-13 ENCOUNTER — Ambulatory Visit
Admission: RE | Admit: 2019-03-13 | Discharge: 2019-03-13 | Disposition: A | Discharge status: Home / Self Care | Payer: Medicare Other | Source: Ambulatory Visit | Attending: Radiation Oncology | Admitting: Radiation Oncology

## 2019-03-13 ENCOUNTER — Other Ambulatory Visit: Payer: Self-pay

## 2019-03-13 DIAGNOSIS — Z510 Encounter for antineoplastic radiation therapy: Secondary | ICD-10-CM | POA: Diagnosis not present

## 2019-03-14 ENCOUNTER — Ambulatory Visit
Admission: RE | Admit: 2019-03-14 | Discharge: 2019-03-14 | Disposition: A | Discharge status: Home / Self Care | Payer: Medicare Other | Source: Ambulatory Visit | Attending: Radiation Oncology | Admitting: Radiation Oncology

## 2019-03-14 ENCOUNTER — Other Ambulatory Visit: Payer: Self-pay

## 2019-03-14 DIAGNOSIS — Z510 Encounter for antineoplastic radiation therapy: Secondary | ICD-10-CM | POA: Diagnosis not present

## 2019-03-17 ENCOUNTER — Ambulatory Visit
Admission: RE | Admit: 2019-03-17 | Discharge: 2019-03-17 | Disposition: A | Discharge status: Home / Self Care | Payer: Medicare Other | Source: Ambulatory Visit | Attending: Radiation Oncology | Admitting: Radiation Oncology

## 2019-03-17 ENCOUNTER — Other Ambulatory Visit: Payer: Self-pay

## 2019-03-17 DIAGNOSIS — Z510 Encounter for antineoplastic radiation therapy: Secondary | ICD-10-CM | POA: Diagnosis not present

## 2019-03-18 ENCOUNTER — Other Ambulatory Visit: Payer: Self-pay

## 2019-03-18 ENCOUNTER — Ambulatory Visit
Admission: RE | Admit: 2019-03-18 | Discharge: 2019-03-18 | Disposition: A | Discharge status: Home / Self Care | Payer: Medicare Other | Source: Ambulatory Visit | Attending: Radiation Oncology | Admitting: Radiation Oncology

## 2019-03-18 DIAGNOSIS — Z510 Encounter for antineoplastic radiation therapy: Secondary | ICD-10-CM | POA: Diagnosis not present

## 2019-03-19 ENCOUNTER — Other Ambulatory Visit: Payer: Self-pay

## 2019-03-19 ENCOUNTER — Ambulatory Visit
Admission: RE | Admit: 2019-03-19 | Discharge: 2019-03-19 | Disposition: A | Discharge status: Home / Self Care | Payer: Medicare Other | Source: Ambulatory Visit | Attending: Radiation Oncology | Admitting: Radiation Oncology

## 2019-03-19 DIAGNOSIS — Z510 Encounter for antineoplastic radiation therapy: Secondary | ICD-10-CM | POA: Diagnosis not present

## 2019-03-20 ENCOUNTER — Other Ambulatory Visit: Payer: Self-pay

## 2019-03-20 ENCOUNTER — Ambulatory Visit
Admission: RE | Admit: 2019-03-20 | Discharge: 2019-03-20 | Disposition: A | Discharge status: Home / Self Care | Payer: Medicare Other | Source: Ambulatory Visit | Attending: Radiation Oncology | Admitting: Radiation Oncology

## 2019-03-20 DIAGNOSIS — Z510 Encounter for antineoplastic radiation therapy: Secondary | ICD-10-CM | POA: Diagnosis not present

## 2019-03-21 ENCOUNTER — Ambulatory Visit
Admission: RE | Admit: 2019-03-21 | Discharge: 2019-03-21 | Disposition: A | Discharge status: Home / Self Care | Payer: Medicare Other | Source: Ambulatory Visit | Attending: Radiation Oncology | Admitting: Radiation Oncology

## 2019-03-21 ENCOUNTER — Other Ambulatory Visit: Payer: Self-pay

## 2019-03-21 DIAGNOSIS — Z510 Encounter for antineoplastic radiation therapy: Secondary | ICD-10-CM | POA: Diagnosis not present

## 2019-03-24 ENCOUNTER — Ambulatory Visit
Admission: RE | Admit: 2019-03-24 | Discharge: 2019-03-24 | Disposition: A | Discharge status: Home / Self Care | Payer: Medicare Other | Source: Ambulatory Visit | Attending: Radiation Oncology | Admitting: Radiation Oncology

## 2019-03-24 ENCOUNTER — Other Ambulatory Visit: Payer: Self-pay

## 2019-03-24 DIAGNOSIS — Z510 Encounter for antineoplastic radiation therapy: Secondary | ICD-10-CM | POA: Diagnosis not present

## 2019-03-25 ENCOUNTER — Ambulatory Visit
Admission: RE | Admit: 2019-03-25 | Discharge: 2019-03-25 | Disposition: A | Discharge status: Home / Self Care | Payer: Medicare Other | Source: Ambulatory Visit | Attending: Radiation Oncology | Admitting: Radiation Oncology

## 2019-03-25 ENCOUNTER — Other Ambulatory Visit: Payer: Self-pay

## 2019-03-25 DIAGNOSIS — Z510 Encounter for antineoplastic radiation therapy: Secondary | ICD-10-CM | POA: Diagnosis not present

## 2019-03-26 ENCOUNTER — Other Ambulatory Visit: Payer: Self-pay

## 2019-03-26 ENCOUNTER — Ambulatory Visit
Admission: RE | Admit: 2019-03-26 | Discharge: 2019-03-26 | Disposition: A | Discharge status: Home / Self Care | Payer: Medicare Other | Source: Ambulatory Visit | Attending: Radiation Oncology | Admitting: Radiation Oncology

## 2019-03-26 DIAGNOSIS — Z510 Encounter for antineoplastic radiation therapy: Secondary | ICD-10-CM | POA: Diagnosis not present

## 2019-03-27 ENCOUNTER — Inpatient Hospital Stay: Payer: Medicare Other | Attending: Oncology

## 2019-03-27 ENCOUNTER — Ambulatory Visit
Admission: RE | Admit: 2019-03-27 | Discharge: 2019-03-27 | Disposition: A | Discharge status: Home / Self Care | Payer: Medicare Other | Source: Ambulatory Visit | Attending: Radiation Oncology | Admitting: Radiation Oncology

## 2019-03-27 ENCOUNTER — Other Ambulatory Visit: Payer: Self-pay

## 2019-03-27 DIAGNOSIS — Z510 Encounter for antineoplastic radiation therapy: Secondary | ICD-10-CM | POA: Diagnosis not present

## 2019-03-27 DIAGNOSIS — D0511 Intraductal carcinoma in situ of right breast: Secondary | ICD-10-CM | POA: Insufficient documentation

## 2019-03-27 LAB — CBC
HCT: 41.5 % (ref 36.0–46.0)
Hemoglobin: 13.6 g/dL (ref 12.0–15.0)
MCH: 30.8 pg (ref 26.0–34.0)
MCHC: 32.8 g/dL (ref 30.0–36.0)
MCV: 93.9 fL (ref 80.0–100.0)
Platelets: 255 10*3/uL (ref 150–400)
RBC: 4.42 MIL/uL (ref 3.87–5.11)
RDW: 12.8 % (ref 11.5–15.5)
WBC: 4.3 10*3/uL (ref 4.0–10.5)
nRBC: 0 % (ref 0.0–0.2)

## 2019-03-28 ENCOUNTER — Other Ambulatory Visit: Payer: Self-pay

## 2019-03-28 ENCOUNTER — Ambulatory Visit
Admission: RE | Admit: 2019-03-28 | Discharge: 2019-03-28 | Disposition: A | Discharge status: Home / Self Care | Payer: Medicare Other | Source: Ambulatory Visit | Attending: Radiation Oncology | Admitting: Radiation Oncology

## 2019-03-28 DIAGNOSIS — Z510 Encounter for antineoplastic radiation therapy: Secondary | ICD-10-CM | POA: Diagnosis not present

## 2019-03-31 ENCOUNTER — Other Ambulatory Visit: Payer: Self-pay

## 2019-03-31 ENCOUNTER — Ambulatory Visit
Admission: RE | Admit: 2019-03-31 | Discharge: 2019-03-31 | Disposition: A | Discharge status: Home / Self Care | Payer: Medicare Other | Source: Ambulatory Visit | Attending: Radiation Oncology | Admitting: Radiation Oncology

## 2019-03-31 DIAGNOSIS — D0511 Intraductal carcinoma in situ of right breast: Secondary | ICD-10-CM | POA: Insufficient documentation

## 2019-03-31 DIAGNOSIS — Z510 Encounter for antineoplastic radiation therapy: Secondary | ICD-10-CM | POA: Insufficient documentation

## 2019-04-01 ENCOUNTER — Other Ambulatory Visit: Payer: Self-pay

## 2019-04-01 ENCOUNTER — Ambulatory Visit
Admission: RE | Admit: 2019-04-01 | Discharge: 2019-04-01 | Disposition: A | Discharge status: Home / Self Care | Payer: Medicare Other | Source: Ambulatory Visit | Attending: Radiation Oncology | Admitting: Radiation Oncology

## 2019-04-01 DIAGNOSIS — Z510 Encounter for antineoplastic radiation therapy: Secondary | ICD-10-CM | POA: Diagnosis not present

## 2019-04-02 ENCOUNTER — Other Ambulatory Visit: Payer: Self-pay

## 2019-04-02 ENCOUNTER — Ambulatory Visit
Admission: RE | Admit: 2019-04-02 | Discharge: 2019-04-02 | Disposition: A | Discharge status: Home / Self Care | Payer: Medicare Other | Source: Ambulatory Visit | Attending: Radiation Oncology | Admitting: Radiation Oncology

## 2019-04-02 DIAGNOSIS — Z510 Encounter for antineoplastic radiation therapy: Secondary | ICD-10-CM | POA: Diagnosis not present

## 2019-04-03 ENCOUNTER — Ambulatory Visit
Admission: RE | Admit: 2019-04-03 | Discharge: 2019-04-03 | Disposition: A | Discharge status: Home / Self Care | Payer: Medicare Other | Source: Ambulatory Visit | Attending: Radiation Oncology | Admitting: Radiation Oncology

## 2019-04-03 ENCOUNTER — Other Ambulatory Visit: Payer: Self-pay

## 2019-04-03 DIAGNOSIS — Z510 Encounter for antineoplastic radiation therapy: Secondary | ICD-10-CM | POA: Diagnosis not present

## 2019-04-04 ENCOUNTER — Ambulatory Visit
Admission: RE | Admit: 2019-04-04 | Discharge: 2019-04-04 | Disposition: A | Discharge status: Home / Self Care | Payer: Medicare Other | Source: Ambulatory Visit | Attending: Radiation Oncology | Admitting: Radiation Oncology

## 2019-04-04 ENCOUNTER — Other Ambulatory Visit: Payer: Self-pay

## 2019-04-04 DIAGNOSIS — Z510 Encounter for antineoplastic radiation therapy: Secondary | ICD-10-CM | POA: Diagnosis not present

## 2019-04-07 ENCOUNTER — Other Ambulatory Visit: Payer: Self-pay

## 2019-04-07 ENCOUNTER — Ambulatory Visit
Admission: RE | Admit: 2019-04-07 | Discharge: 2019-04-07 | Disposition: A | Discharge status: Home / Self Care | Payer: Medicare Other | Source: Ambulatory Visit | Attending: Radiation Oncology | Admitting: Radiation Oncology

## 2019-04-07 DIAGNOSIS — Z510 Encounter for antineoplastic radiation therapy: Secondary | ICD-10-CM | POA: Diagnosis not present

## 2019-04-08 ENCOUNTER — Other Ambulatory Visit: Payer: Self-pay

## 2019-04-08 ENCOUNTER — Ambulatory Visit
Admission: RE | Admit: 2019-04-08 | Discharge: 2019-04-08 | Disposition: A | Discharge status: Home / Self Care | Payer: Medicare Other | Source: Ambulatory Visit | Attending: Radiation Oncology | Admitting: Radiation Oncology

## 2019-04-08 DIAGNOSIS — Z510 Encounter for antineoplastic radiation therapy: Secondary | ICD-10-CM | POA: Diagnosis not present

## 2019-04-09 ENCOUNTER — Ambulatory Visit
Admission: RE | Admit: 2019-04-09 | Discharge: 2019-04-09 | Disposition: A | Discharge status: Home / Self Care | Payer: Medicare Other | Source: Ambulatory Visit | Attending: Radiation Oncology | Admitting: Radiation Oncology

## 2019-04-09 ENCOUNTER — Other Ambulatory Visit: Payer: Self-pay

## 2019-04-09 DIAGNOSIS — Z510 Encounter for antineoplastic radiation therapy: Secondary | ICD-10-CM | POA: Diagnosis not present

## 2019-04-10 ENCOUNTER — Ambulatory Visit
Admission: RE | Admit: 2019-04-10 | Discharge: 2019-04-10 | Disposition: A | Discharge status: Home / Self Care | Payer: Medicare Other | Source: Ambulatory Visit | Attending: Radiation Oncology | Admitting: Radiation Oncology

## 2019-04-10 ENCOUNTER — Ambulatory Visit: Payer: Medicare Other

## 2019-04-10 ENCOUNTER — Other Ambulatory Visit: Payer: Self-pay

## 2019-04-10 ENCOUNTER — Inpatient Hospital Stay: Payer: Medicare Other | Attending: Oncology

## 2019-04-10 DIAGNOSIS — Z510 Encounter for antineoplastic radiation therapy: Secondary | ICD-10-CM | POA: Diagnosis not present

## 2019-04-10 DIAGNOSIS — Z170 Estrogen receptor positive status [ER+]: Secondary | ICD-10-CM | POA: Insufficient documentation

## 2019-04-10 DIAGNOSIS — D0511 Intraductal carcinoma in situ of right breast: Secondary | ICD-10-CM

## 2019-04-10 LAB — CBC
HCT: 41.4 % (ref 36.0–46.0)
Hemoglobin: 13.5 g/dL (ref 12.0–15.0)
MCH: 30.7 pg (ref 26.0–34.0)
MCHC: 32.6 g/dL (ref 30.0–36.0)
MCV: 94.1 fL (ref 80.0–100.0)
Platelets: 237 10*3/uL (ref 150–400)
RBC: 4.4 MIL/uL (ref 3.87–5.11)
RDW: 12.9 % (ref 11.5–15.5)
WBC: 3.8 10*3/uL — ABNORMAL LOW (ref 4.0–10.5)
nRBC: 0 % (ref 0.0–0.2)

## 2019-04-11 ENCOUNTER — Other Ambulatory Visit: Payer: Self-pay

## 2019-04-11 ENCOUNTER — Ambulatory Visit
Admission: RE | Admit: 2019-04-11 | Discharge: 2019-04-11 | Disposition: A | Discharge status: Home / Self Care | Payer: Medicare Other | Source: Ambulatory Visit | Attending: Radiation Oncology | Admitting: Radiation Oncology

## 2019-04-11 DIAGNOSIS — Z510 Encounter for antineoplastic radiation therapy: Secondary | ICD-10-CM | POA: Diagnosis not present

## 2019-04-14 ENCOUNTER — Ambulatory Visit
Admission: RE | Admit: 2019-04-14 | Discharge: 2019-04-14 | Disposition: A | Discharge status: Home / Self Care | Payer: Medicare Other | Source: Ambulatory Visit | Attending: Radiation Oncology | Admitting: Radiation Oncology

## 2019-04-14 ENCOUNTER — Other Ambulatory Visit: Payer: Self-pay

## 2019-04-14 DIAGNOSIS — Z510 Encounter for antineoplastic radiation therapy: Secondary | ICD-10-CM | POA: Diagnosis not present

## 2019-04-15 ENCOUNTER — Other Ambulatory Visit: Payer: Self-pay

## 2019-04-15 ENCOUNTER — Ambulatory Visit
Admission: RE | Admit: 2019-04-15 | Discharge: 2019-04-15 | Disposition: A | Discharge status: Home / Self Care | Payer: Medicare Other | Source: Ambulatory Visit | Attending: Radiation Oncology | Admitting: Radiation Oncology

## 2019-04-15 DIAGNOSIS — Z510 Encounter for antineoplastic radiation therapy: Secondary | ICD-10-CM | POA: Diagnosis not present

## 2019-04-16 ENCOUNTER — Ambulatory Visit
Admission: RE | Admit: 2019-04-16 | Discharge: 2019-04-16 | Disposition: A | Discharge status: Home / Self Care | Payer: Medicare Other | Source: Ambulatory Visit | Attending: Radiation Oncology | Admitting: Radiation Oncology

## 2019-04-16 ENCOUNTER — Other Ambulatory Visit: Payer: Self-pay

## 2019-04-16 DIAGNOSIS — Z510 Encounter for antineoplastic radiation therapy: Secondary | ICD-10-CM | POA: Diagnosis not present

## 2019-04-17 ENCOUNTER — Ambulatory Visit: Payer: Medicare Other

## 2019-04-18 ENCOUNTER — Ambulatory Visit
Admission: RE | Admit: 2019-04-18 | Discharge: 2019-04-18 | Disposition: A | Discharge status: Home / Self Care | Payer: Medicare Other | Source: Ambulatory Visit | Attending: Radiation Oncology | Admitting: Radiation Oncology

## 2019-04-18 ENCOUNTER — Other Ambulatory Visit: Payer: Self-pay

## 2019-04-18 DIAGNOSIS — Z510 Encounter for antineoplastic radiation therapy: Secondary | ICD-10-CM | POA: Diagnosis not present

## 2019-04-21 ENCOUNTER — Ambulatory Visit
Admission: RE | Admit: 2019-04-21 | Discharge: 2019-04-21 | Disposition: A | Discharge status: Home / Self Care | Payer: Medicare Other | Source: Ambulatory Visit | Attending: Radiation Oncology | Admitting: Radiation Oncology

## 2019-04-21 ENCOUNTER — Other Ambulatory Visit: Payer: Self-pay

## 2019-04-21 ENCOUNTER — Ambulatory Visit: Payer: Medicare Other

## 2019-04-21 DIAGNOSIS — Z510 Encounter for antineoplastic radiation therapy: Secondary | ICD-10-CM | POA: Diagnosis not present

## 2019-04-22 ENCOUNTER — Other Ambulatory Visit: Payer: Self-pay

## 2019-04-22 ENCOUNTER — Ambulatory Visit
Admission: RE | Admit: 2019-04-22 | Discharge: 2019-04-22 | Disposition: A | Discharge status: Home / Self Care | Payer: Medicare Other | Source: Ambulatory Visit | Attending: Radiation Oncology | Admitting: Radiation Oncology

## 2019-04-22 DIAGNOSIS — Z510 Encounter for antineoplastic radiation therapy: Secondary | ICD-10-CM | POA: Diagnosis not present

## 2019-04-23 ENCOUNTER — Ambulatory Visit
Admission: RE | Admit: 2019-04-23 | Discharge: 2019-04-23 | Disposition: A | Discharge status: Home / Self Care | Payer: Medicare Other | Source: Ambulatory Visit | Attending: Radiation Oncology | Admitting: Radiation Oncology

## 2019-04-23 ENCOUNTER — Other Ambulatory Visit: Payer: Self-pay

## 2019-04-23 DIAGNOSIS — Z510 Encounter for antineoplastic radiation therapy: Secondary | ICD-10-CM | POA: Diagnosis not present

## 2019-04-24 ENCOUNTER — Inpatient Hospital Stay: Payer: Medicare Other

## 2019-04-24 ENCOUNTER — Ambulatory Visit
Admission: RE | Admit: 2019-04-24 | Discharge: 2019-04-24 | Disposition: A | Discharge status: Home / Self Care | Payer: Medicare Other | Source: Ambulatory Visit | Attending: Radiation Oncology | Admitting: Radiation Oncology

## 2019-04-24 ENCOUNTER — Other Ambulatory Visit: Payer: Self-pay

## 2019-04-24 DIAGNOSIS — D0511 Intraductal carcinoma in situ of right breast: Secondary | ICD-10-CM | POA: Diagnosis not present

## 2019-04-24 DIAGNOSIS — Z510 Encounter for antineoplastic radiation therapy: Secondary | ICD-10-CM | POA: Diagnosis not present

## 2019-04-24 LAB — CBC
HCT: 45.6 % (ref 36.0–46.0)
Hemoglobin: 15.1 g/dL — ABNORMAL HIGH (ref 12.0–15.0)
MCH: 31.1 pg (ref 26.0–34.0)
MCHC: 33.1 g/dL (ref 30.0–36.0)
MCV: 93.8 fL (ref 80.0–100.0)
Platelets: 243 10*3/uL (ref 150–400)
RBC: 4.86 MIL/uL (ref 3.87–5.11)
RDW: 13.1 % (ref 11.5–15.5)
WBC: 4.5 10*3/uL (ref 4.0–10.5)
nRBC: 0 % (ref 0.0–0.2)

## 2019-04-25 ENCOUNTER — Ambulatory Visit
Admission: RE | Admit: 2019-04-25 | Discharge: 2019-04-25 | Disposition: A | Discharge status: Home / Self Care | Payer: Medicare Other | Source: Ambulatory Visit | Attending: Radiation Oncology | Admitting: Radiation Oncology

## 2019-04-25 ENCOUNTER — Other Ambulatory Visit: Payer: Self-pay

## 2019-04-25 DIAGNOSIS — Z510 Encounter for antineoplastic radiation therapy: Secondary | ICD-10-CM | POA: Diagnosis not present

## 2019-04-28 ENCOUNTER — Ambulatory Visit
Admission: RE | Admit: 2019-04-28 | Discharge: 2019-04-28 | Disposition: A | Discharge status: Home / Self Care | Payer: Medicare Other | Source: Ambulatory Visit | Attending: Radiation Oncology | Admitting: Radiation Oncology

## 2019-04-28 ENCOUNTER — Other Ambulatory Visit: Payer: Self-pay

## 2019-04-28 DIAGNOSIS — Z510 Encounter for antineoplastic radiation therapy: Secondary | ICD-10-CM | POA: Insufficient documentation

## 2019-04-28 DIAGNOSIS — D0511 Intraductal carcinoma in situ of right breast: Secondary | ICD-10-CM | POA: Diagnosis present

## 2019-04-29 ENCOUNTER — Ambulatory Visit
Admission: RE | Admit: 2019-04-29 | Discharge: 2019-04-29 | Disposition: A | Discharge status: Home / Self Care | Payer: Medicare Other | Source: Ambulatory Visit | Attending: Radiation Oncology | Admitting: Radiation Oncology

## 2019-04-29 ENCOUNTER — Other Ambulatory Visit: Payer: Self-pay

## 2019-04-29 DIAGNOSIS — Z510 Encounter for antineoplastic radiation therapy: Secondary | ICD-10-CM | POA: Diagnosis not present

## 2019-04-30 ENCOUNTER — Ambulatory Visit: Payer: Medicare Other

## 2019-04-30 ENCOUNTER — Ambulatory Visit
Admission: RE | Admit: 2019-04-30 | Discharge: 2019-04-30 | Disposition: A | Discharge status: Home / Self Care | Payer: Medicare Other | Source: Ambulatory Visit | Attending: Radiation Oncology | Admitting: Radiation Oncology

## 2019-04-30 ENCOUNTER — Other Ambulatory Visit: Payer: Self-pay

## 2019-04-30 DIAGNOSIS — Z510 Encounter for antineoplastic radiation therapy: Secondary | ICD-10-CM | POA: Diagnosis not present

## 2019-05-01 ENCOUNTER — Other Ambulatory Visit: Payer: Self-pay

## 2019-05-01 ENCOUNTER — Ambulatory Visit
Admission: RE | Admit: 2019-05-01 | Discharge: 2019-05-01 | Disposition: A | Discharge status: Home / Self Care | Payer: Medicare Other | Source: Ambulatory Visit | Attending: Radiation Oncology | Admitting: Radiation Oncology

## 2019-05-01 DIAGNOSIS — Z510 Encounter for antineoplastic radiation therapy: Secondary | ICD-10-CM | POA: Diagnosis not present

## 2019-05-23 ENCOUNTER — Encounter: Payer: Self-pay | Admitting: Oncology

## 2019-05-23 ENCOUNTER — Other Ambulatory Visit: Payer: Self-pay

## 2019-05-24 NOTE — Progress Notes (Signed)
Chelsea Peters  Telephone:([phone]) [phone] Fax:(336) [phone]  ID: DON GIARRUSSO OB: 05-14-1940  MR#: [id number]  LYY#:[id number]  Patient Care Team: Rusty Aus, MD as PCP - General (Internal Medicine) Benjamine Sprague, DO as Consulting Physician (Surgery) Rico Junker, RN as Oncology Nurse Navigator Grayland Ormond, Kathlene November, MD as Consulting Physician (Oncology) Noreene Filbert, MD as Referring Physician (Radiation Oncology)  CHIEF COMPLAINT: DCIS, right breast  INTERVAL HISTORY: Patient returns to clinic today for further evaluation and to assess her toleration of tamoxifen.  She completed XRT several weeks ago and tolerated her treatments well with only some mild skin discomfort.  She initiated tamoxifen soon after completion and is tolerating it well without significant side effects.  She has no neurologic complaints.  She denies any recent fevers or illnesses.  She has a good appetite and denies weight loss.  She has no chest pain, shortness of breath, cough, or hemoptysis.  She denies any nausea, vomiting, constipation, or diarrhea.  She has no urinary complaints.  Patient feels at her baseline offers no specific complaints today.  REVIEW OF SYSTEMS:   Review of Systems  Constitutional: Negative.  Negative for fever and malaise/fatigue.  Respiratory: Negative.  Negative for cough, hemoptysis and shortness of breath.   Cardiovascular: Negative.  Negative for chest pain and leg swelling.  Gastrointestinal: Negative.  Negative for abdominal pain.  Genitourinary: Negative.  Negative for dysuria.  Musculoskeletal: Negative.  Negative for back pain.  Skin: Negative.  Negative for rash.  Neurological: Negative.  Negative for dizziness, focal weakness, weakness and headaches.  Psychiatric/Behavioral: Negative.  The patient is not nervous/anxious.     As per HPI. Otherwise, a complete review of systems is negative.  PAST MEDICAL HISTORY: Past Medical History:  Diagnosis  Date  . Cancer ([place])   . COVID-19 09/2018  . GERD (gastroesophageal reflux disease)   . Hyperlipidemia   . Hypertension   . Hypothyroidism   . Lumbar disc disease   . MVP (mitral valve prolapse)   . Pneumonia 09/2018   pt states she was positive for covid then as well    PAST SURGICAL HISTORY: Past Surgical History:  Procedure Laterality Date  . ABDOMINAL HYSTERECTOMY    . BREAST BIOPSY Right 01/08/2019   Affirm Biopsy- X-Clip- DCIS  . BREAST LUMPECTOMY Right 02/13/2019  . CARDIAC CATHETERIZATION N/A 06/02/2015   Procedure: Left Heart Cath and Coronary Angiography;  Surgeon: Teodoro Spray, MD;  Location: [place] [facility];  Service: Cardiovascular;  Laterality: N/A;  . COLONOSCOPY WITH PROPOFOL N/A 01/27/2019   Procedure: COLONOSCOPY WITH PROPOFOL;  Surgeon: Toledo, Benay Pike, MD;  Location: [facility];  Service: Gastroenterology;  Laterality: N/A;  . ESOPHAGOGASTRODUODENOSCOPY (EGD) WITH PROPOFOL N/A 01/27/2019   Procedure: ESOPHAGOGASTRODUODENOSCOPY (EGD) WITH PROPOFOL;  Surgeon: Toledo, Benay Pike, MD;  Location: [facility];  Service: Gastroenterology;  Laterality: N/A;  . PARTIAL MASTECTOMY WITH AXILLARY SENTINEL LYMPH NODE BIOPSY Right 02/13/2019   Procedure: PARTIAL MASTECTOMY WITH AXILLARY SENTINEL LYMPH NODE BIOPSY;  Surgeon: Benjamine Sprague, DO;  Location: [facility];  Service: General;  Laterality: Right;  . STOMACH SURGERY    . THYROID SURGERY      FAMILY HISTORY: Family History  Problem Relation Age of Onset  . Breast cancer Mother 63  . Breast cancer Maternal Aunt   . Breast cancer Maternal Aunt   . Stomach cancer Other   . Throat cancer Other     ADVANCED DIRECTIVES (Y/N):  N  HEALTH MAINTENANCE: Social History  Tobacco Use  . Smoking status: Never Smoker  . Smokeless tobacco: Never Used  Substance Use Topics  . Alcohol use: Not Currently  . Drug use: No     Colonoscopy:  PAP:  Bone density:  Lipid panel:  Allergies  Allergen Reactions    . Ambien [Zolpidem] Other (See Comments)    erratic behavior/"made me crazy"  . Latex Itching    redness  . Paxil [Paroxetine] Other (See Comments)    Stomach pains.  . Penicillins Other (See Comments)    Blisters in nose Did it involve swelling of the face/tongue/throat, SOB, or low BP? No Did it involve sudden or severe rash/hives, skin peeling, or any reaction on the inside of your mouth or nose? Yes--blisters in nose Did you need to seek medical attention at a hospital or doctor's office? No When did it last happen?More than 10 years ago If all above answers are "NO", may proceed with cephalosporin use.     Current Outpatient Medications  Medication Sig Dispense Refill  . ALPRAZolam (XANAX) 0.25 MG tablet Take 0.25 mg by mouth daily as needed for anxiety.    Marland Kitchen amLODipine (NORVASC) 5 MG tablet Take 5 mg by mouth every morning.   3  . Biotin 1 MG CAPS Take 1 mg by mouth daily.    . Cholecalciferol (D 2000) 2000 units TABS Take 2,000 Units by mouth daily.    . DULoxetine (CYMBALTA) 30 MG capsule Take 30 mg by mouth daily. Reported on 06/02/2015  0  . estradiol (ESTRACE) 1 MG tablet Take 0.5 mg by mouth daily.    Marland Kitchen FLUoxetine (PROZAC) 20 MG capsule Take 20 mg by mouth daily.    Marland Kitchen ibuprofen (ADVIL) 800 MG tablet Take 1 tablet (800 mg total) by mouth every 8 (eight) hours as needed for mild pain or moderate pain. 30 tablet 0  . levothyroxine (SYNTHROID, LEVOTHROID) 25 MCG tablet Take 25 mcg by mouth daily before breakfast.     . montelukast (SINGULAIR) 10 MG tablet Take 10 mg by mouth daily.    . Multiple Vitamin (MULTIVITAMIN WITH MINERALS) TABS tablet Take 1 tablet by mouth daily.    Marland Kitchen omeprazole (PRILOSEC) 20 MG capsule Take 20 mg by mouth every morning.   0  . tamoxifen (NOLVADEX) 20 MG tablet Take 1 tablet (20 mg total) by mouth daily. 90 tablet 3  . HYDROcodone-acetaminophen (NORCO) 5-325 MG tablet Take 1 tablet by mouth every 6 (six) hours as needed for up to 6 doses for  moderate pain. (Patient not taking: Reported on 05/23/2019) 6 tablet 0  . solifenacin (VESICARE) 5 MG tablet Take 5 mg by mouth daily.     No current facility-administered medications for this visit.    OBJECTIVE: Vitals:   05/23/19 1557  BP: (!) 159/74  Pulse: 62  Temp: (!) 96.1 F (35.6 C)  SpO2: 100%     Body mass index is 20.45 kg/m.    ECOG FS:0 - Asymptomatic  General: Well-developed, well-nourished, no acute distress. Eyes: Pink conjunctiva, anicteric sclera. HEENT: Normocephalic, moist mucous membranes. Breast: Small nodule palpated in right axilla likely representing scar tissue. Lungs: No audible wheezing or coughing. Heart: Regular rate and rhythm. Abdomen: Soft, nontender, no obvious distention. Musculoskeletal: No edema, cyanosis, or clubbing. Neuro: Alert, answering all questions appropriately. Cranial nerves grossly intact. Skin: No rashes or petechiae noted. Psych: Normal affect.   LAB RESULTS:  Lab Results  Component Value Date   NA 136 10/05/2018   K 3.8  10/05/2018   CL 100 10/05/2018   CO2 27 10/05/2018   GLUCOSE 88 10/05/2018   BUN 25 (H) 10/05/2018   CREATININE 0.79 10/05/2018   CALCIUM 9.0 10/05/2018   PROT 7.1 05/19/2015   ALBUMIN 4.1 05/19/2015   AST 31 05/19/2015   ALT 17 05/19/2015   ALKPHOS 61 05/19/2015   BILITOT 0.6 05/19/2015   GFRNONAA >60 10/05/2018   GFRAA >60 10/05/2018    Lab Results  Component Value Date   WBC 4.5 04/24/2019   NEUTROABS 2.9 05/19/2015   HGB 15.1 (H) 04/24/2019   HCT 45.6 04/24/2019   MCV 93.8 04/24/2019   PLT 243 04/24/2019     STUDIES: No results found.  ASSESSMENT: DCIS, right breast  PLAN:    1. DCIS, right breast: Patient underwent lumpectomy on February 13, 2019.  Although patient had close margins of 1 mm, it was determined that reexcision is not necessary.  She proceeded directly with adjuvant XRT completing in March 2021.  Patient initiated tamoxifen soon after completing XRT and will  take 5 years of treatment through March 2026.  No intervention is needed at this time.  Return to clinic in 6 months for routine evaluation.   I spent a total of 20 minutes reviewing chart data, face-to-face evaluation with the patient, counseling and coordination of care as detailed above.   Patient expressed understanding and was in agreement with this plan. She also understands that She can call clinic at any time with any questions, concerns, or complaints.   Cancer Staging Ductal carcinoma in situ (DCIS) of right breast Staging form: Breast, AJCC 8th Edition - Clinical stage from 01/16/2019: Stage 0 (cTis (DCIS), cN0, cM0, ER+, PR+, HER2-) - Signed by Lloyd Huger, MD on 01/16/2019   Lloyd Huger, MD   05/26/2019 11:25 AM

## 2019-05-26 ENCOUNTER — Inpatient Hospital Stay: Payer: Medicare Other | Attending: Oncology | Admitting: Oncology

## 2019-05-26 ENCOUNTER — Encounter: Payer: Self-pay | Admitting: Oncology

## 2019-05-26 ENCOUNTER — Ambulatory Visit
Admission: RE | Admit: 2019-05-26 | Discharge: 2019-05-26 | Disposition: A | Discharge status: Home / Self Care | Payer: Medicare Other | Source: Ambulatory Visit | Attending: Radiation Oncology | Admitting: Radiation Oncology

## 2019-05-26 VITALS — BP 159/74 | HR 62 | Temp 96.1°F | Wt 110.0 lb

## 2019-05-26 DIAGNOSIS — Z800 Family history of malignant neoplasm of digestive organs: Secondary | ICD-10-CM | POA: Diagnosis not present

## 2019-05-26 DIAGNOSIS — Z923 Personal history of irradiation: Secondary | ICD-10-CM | POA: Insufficient documentation

## 2019-05-26 DIAGNOSIS — Z510 Encounter for antineoplastic radiation therapy: Secondary | ICD-10-CM | POA: Diagnosis not present

## 2019-05-26 DIAGNOSIS — Z803 Family history of malignant neoplasm of breast: Secondary | ICD-10-CM | POA: Diagnosis not present

## 2019-05-26 DIAGNOSIS — Z801 Family history of malignant neoplasm of trachea, bronchus and lung: Secondary | ICD-10-CM | POA: Diagnosis not present

## 2019-05-26 DIAGNOSIS — Z79810 Long term (current) use of selective estrogen receptor modulators (SERMs): Secondary | ICD-10-CM | POA: Diagnosis not present

## 2019-05-26 DIAGNOSIS — D0511 Intraductal carcinoma in situ of right breast: Secondary | ICD-10-CM

## 2019-05-26 NOTE — Progress Notes (Signed)
Radiation Oncology Follow up Note  Name: Chelsea Peters   Date:   05/26/2019 MRN:  [id number] DOB: Jan 28, 1941    This 79 y.o. female presents to the clinic today for 1 month follow-up status post whole breast radiation to her right breast for ER positive ductal carcinoma in situ.  REFERRING PROVIDER: Rusty Aus, MD  HPI: Patient is a 79 year old female now at 1 month having completed whole breast radiation to right breast for ER positive ductal carcinoma in situ.  Seen today in routine follow-up she is doing well she specifically denies breast tenderness cough or bone pain..  She has been started on tamoxifen and is tolerating that well without side effect.  COMPLICATIONS OF TREATMENT: none  FOLLOW UP COMPLIANCE: keeps appointments   PHYSICAL EXAM:  There were no vitals taken for this visit. Lungs are clear to A&P cardiac examination essentially unremarkable with regular rate and rhythm. No dominant mass or nodularity is noted in either breast in 2 positions examined. Incision is well-healed. No axillary or supraclavicular adenopathy is appreciated. Cosmetic result is excellent.  Well-developed well-nourished patient in NAD. HEENT reveals PERLA, EOMI, discs not visualized.  Oral cavity is clear. No oral mucosal lesions are identified. Neck is clear without evidence of cervical or supraclavicular adenopathy. Lungs are clear to A&P. Cardiac examination is essentially unremarkable with regular rate and rhythm without murmur rub or thrill. Abdomen is benign with no organomegaly or masses noted. Motor sensory and DTR levels are equal and symmetric in the upper and lower extremities. Cranial nerves II through XII are grossly intact. Proprioception is intact. No peripheral adenopathy or edema is identified. No motor or sensory levels are noted. Crude visual fields are within normal range.  RADIOLOGY RESULTS: No current films for review  PLAN: Present time she is doing well recovering nicely from  radiation therapy with very low side effect profile.  Since I have asked to see her back in 4 to 5 months for follow-up.  She continues on tamoxifen without side effect.  Patient knows to call with any concerns.  I would like to take this opportunity to thank you for allowing me to participate in the care of your patient.Noreene Filbert, MD

## 2019-07-09 ENCOUNTER — Telehealth: Payer: Self-pay | Admitting: *Deleted

## 2019-07-09 NOTE — Telephone Encounter (Signed)
Spoke to patient via telephone. She accepted appointment for tomorrow at 10:30 with Rulon Abide, NP in the symptom management clinic.

## 2019-07-09 NOTE — Telephone Encounter (Signed)
Patient called reporting that she has a lump in her left breast and she would like to be checked. Please advise

## 2019-07-10 ENCOUNTER — Inpatient Hospital Stay: Payer: Medicare Other | Admitting: Oncology

## 2019-08-25 ENCOUNTER — Ambulatory Visit: Payer: Medicare Other | Admitting: Oncology

## 2019-10-29 ENCOUNTER — Ambulatory Visit: Payer: Medicare Other | Admitting: Radiation Oncology

## 2019-11-07 ENCOUNTER — Other Ambulatory Visit: Payer: Self-pay | Admitting: Surgery

## 2019-11-07 DIAGNOSIS — Z853 Personal history of malignant neoplasm of breast: Secondary | ICD-10-CM

## 2019-11-21 NOTE — Progress Notes (Signed)
[place]  [phone] [phone]  ID: Otis Dials OB: [birth year]  MR#: [id number]  GYI#:[id number]  Patient Care Team: Rusty Aus, MD as PCP - General (Internal Medicine) Benjamine Sprague, DO as Consulting Physician (Surgery) Rico Junker, RN as Oncology Nurse Navigator Grayland Ormond, Kathlene November, MD as Consulting Physician (Oncology) Noreene Filbert, MD as Referring Physician (Radiation Oncology)  CHIEF COMPLAINT: DCIS, right breast  INTERVAL HISTORY: Patient returns to clinic today for routine 34-monthevaluation.  She currently feels well and is asymptomatic.  She has occasional hot flashes with tamoxifen, but otherwise is tolerating treatment well.  She has no neurologic complaints.  She denies any recent fevers or illnesses.  She has a good appetite and denies weight loss.  She has no chest pain, shortness of breath, cough, or hemoptysis.  She denies any nausea, vomiting, constipation, or diarrhea.  She has no urinary complaints.  Patient feels at her baseline and offers no further specific complaints today.  REVIEW OF SYSTEMS:   Review of Systems  Constitutional: Negative.  Negative for fever and malaise/fatigue.  Respiratory: Negative.  Negative for cough, hemoptysis and shortness of breath.   Cardiovascular: Negative.  Negative for chest pain and leg swelling.  Gastrointestinal: Negative.  Negative for abdominal pain.  Genitourinary: Negative.  Negative for dysuria.  Musculoskeletal: Negative.  Negative for back pain.  Skin: Negative.  Negative for rash.  Neurological: Positive for sensory change. Negative for dizziness, focal weakness, weakness and headaches.  Psychiatric/Behavioral: Negative.  The patient is not nervous/anxious.     As per HPI. Otherwise, a complete review of systems is negative.  PAST MEDICAL HISTORY: Past Medical History:  Diagnosis Date  . Cancer ([facility]   . COVID-19 09/2018  . GERD (gastroesophageal reflux  disease)   . Hyperlipidemia   . Hypertension   . Hypothyroidism   . Lumbar disc disease   . MVP (mitral valve prolapse)   . Pneumonia 09/2018   pt states she was positive for covid then as well    PAST SURGICAL HISTORY: Past Surgical History:  Procedure Laterality Date  . ABDOMINAL HYSTERECTOMY    . BREAST BIOPSY Right 01/08/2019   Affirm Biopsy- X-Clip- DCIS  . BREAST LUMPECTOMY Right 02/13/2019  . CARDIAC CATHETERIZATION N/A 06/02/2015   Procedure: Left Heart Cath and Coronary Angiography;  Surgeon: KTeodoro Spray MD;  [facility];  Service: Cardiovascular;  Laterality: N/A;  . COLONOSCOPY WITH PROPOFOL N/A 01/27/2019   Procedure: COLONOSCOPY WITH PROPOFOL;  Surgeon: Toledo, TBenay Pike MD;  Location: [facility];  Service: Gastroenterology;  Laterality: N/A;  . ESOPHAGOGASTRODUODENOSCOPY (EGD) WITH PROPOFOL N/A 01/27/2019   Procedure: ESOPHAGOGASTRODUODENOSCOPY (EGD) WITH PROPOFOL;  Surgeon: Toledo, TBenay Pike MD;  Location: [facility];  Service: Gastroenterology;  Laterality: N/A;  . PARTIAL MASTECTOMY WITH AXILLARY SENTINEL LYMPH NODE BIOPSY Right 02/13/2019   Procedure: PARTIAL MASTECTOMY WITH AXILLARY SENTINEL LYMPH NODE BIOPSY;  Surgeon: SBenjamine Sprague DO;  Location: [facility] ORS;  Service: General;  Laterality: Right;  . STOMACH SURGERY    . THYROID SURGERY      FAMILY HISTORY: Family History  Problem Relation Age of Onset  . Breast cancer Mother 83 . Breast cancer Maternal Aunt   . Breast cancer Maternal Aunt   . Stomach cancer Other   . Throat cancer Other     ADVANCED DIRECTIVES (Y/N):  N  HEALTH MAINTENANCE: Social History   Tobacco Use  . Smoking status: Never Smoker  . Smokeless tobacco:  Never Used  Vaping Use  . Vaping Use: Never used  Substance Use Topics  . Alcohol use: Not Currently  . Drug use: No     Colonoscopy:  PAP:  Bone density:  Lipid panel:  Allergies  Allergen Reactions  . Ambien [Zolpidem] Other (See  Comments)    erratic behavior/"made me crazy"  . Latex Itching    redness  . Paxil [Paroxetine] Other (See Comments)    Stomach pains.  . Penicillins Other (See Comments)    Blisters in nose Did it involve swelling of the face/tongue/throat, SOB, or low BP? No Did it involve sudden or severe rash/hives, skin peeling, or any reaction on the inside of your mouth or nose? Yes--blisters in nose Did you need to seek medical attention at a hospital or doctor's office? No When did it last happen?More than 10 years ago If all above answers are "NO", may proceed with cephalosporin use.     Current Outpatient Medications  Medication Sig Dispense Refill  . ALPRAZolam (XANAX) 0.25 MG tablet Take 0.25 mg by mouth daily as needed for anxiety.    Marland Kitchen amLODipine (NORVASC) 5 MG tablet Take 5 mg by mouth every morning.   3  . Cholecalciferol (D 2000) 2000 units TABS Take 2,000 Units by mouth daily.     . DULoxetine (CYMBALTA) 30 MG capsule Take 30 mg by mouth daily. Reported on 06/02/2015  0  . estradiol (ESTRACE) 1 MG tablet Take 0.5 mg by mouth daily.     Marland Kitchen FLUoxetine (PROZAC) 20 MG capsule Take 20 mg by mouth daily.    Marland Kitchen HYDROcodone-acetaminophen (NORCO) 5-325 MG tablet Take 1 tablet by mouth every 6 (six) hours as needed for up to 6 doses for moderate pain. 6 tablet 0  . ibuprofen (ADVIL) 800 MG tablet Take 1 tablet (800 mg total) by mouth every 8 (eight) hours as needed for mild pain or moderate pain. 30 tablet 0  . levothyroxine (SYNTHROID, LEVOTHROID) 25 MCG tablet Take 25 mcg by mouth daily before breakfast.     . montelukast (SINGULAIR) 10 MG tablet Take 10 mg by mouth daily.     . Multiple Vitamin (MULTIVITAMIN WITH MINERALS) TABS tablet Take 1 tablet by mouth daily.     Marland Kitchen omeprazole (PRILOSEC) 20 MG capsule Take 20 mg by mouth every morning.   0  . solifenacin (VESICARE) 5 MG tablet Take 5 mg by mouth daily.    . tamoxifen (NOLVADEX) 20 MG tablet Take 1 tablet (20 mg total) by mouth daily. 90  tablet 3  . Biotin 1 MG CAPS Take 1 mg by mouth daily.  (Patient not taking: Reported on 11/25/2019)     No current facility-administered medications for this visit.    OBJECTIVE: Vitals:   11/25/19 1033  BP: (!) 142/74  Pulse: 70  Resp: 20  Temp: 97.6 F (36.4 C)  SpO2: 100%     Body mass index is 20.56 kg/m.    ECOG FS:0 - Asymptomatic  General: Well-developed, well-nourished, no acute distress. Eyes: Pink conjunctiva, anicteric sclera. HEENT: Normocephalic, moist mucous membranes. Breast: Exam deferred today. Lungs: No audible wheezing or coughing. Heart: Regular rate and rhythm. Abdomen: Soft, nontender, no obvious distention. Musculoskeletal: No edema, cyanosis, or clubbing. Neuro: Alert, answering all questions appropriately. Cranial nerves grossly intact. Skin: No rashes or petechiae noted. Psych: Normal affect.   LAB RESULTS:  Lab Results  Component Value Date   NA 136 10/05/2018   K 3.8 10/05/2018   CL  100 10/05/2018   CO2 27 10/05/2018   GLUCOSE 88 10/05/2018   BUN 25 (H) 10/05/2018   CREATININE 0.79 10/05/2018   CALCIUM 9.0 10/05/2018   PROT 7.1 05/19/2015   ALBUMIN 4.1 05/19/2015   AST 31 05/19/2015   ALT 17 05/19/2015   ALKPHOS 61 05/19/2015   BILITOT 0.6 05/19/2015   GFRNONAA >60 10/05/2018   GFRAA >60 10/05/2018    Lab Results  Component Value Date   WBC 4.5 04/24/2019   NEUTROABS 2.9 05/19/2015   HGB 15.1 (H) 04/24/2019   HCT 45.6 04/24/2019   MCV 93.8 04/24/2019   PLT 243 04/24/2019     STUDIES: No results found.  ASSESSMENT: DCIS, right breast  PLAN:    1. DCIS, right breast: Patient underwent lumpectomy on February 13, 2019.  Although patient had close margins of 1 mm, it was determined that reexcision is not necessary.  She proceeded directly with adjuvant XRT completing in March 2021.  Continue tamoxifen for total 5 years completing treatment in March 2026.  Patient has a mammogram scheduled for next month.  Return to clinic  in 6 months for routine evaluation.   I spent a total of 20 minutes reviewing chart data, face-to-face evaluation with the patient, counseling and coordination of care as detailed above.  Patient expressed understanding and was in agreement with this plan. She also understands that She can call clinic at any time with any questions, concerns, or complaints.   Cancer Staging Ductal carcinoma in situ (DCIS) of right breast Staging form: Breast, AJCC 8th Edition - Clinical stage from 01/16/2019: Stage 0 (cTis (DCIS), cN0, cM0, ER+, PR+, HER2-) - Signed by Lloyd Huger, MD on 01/16/2019   Lloyd Huger, MD   11/26/2019 10:20 AM

## 2019-11-25 ENCOUNTER — Encounter: Payer: Self-pay | Admitting: Oncology

## 2019-11-25 ENCOUNTER — Ambulatory Visit
Admission: RE | Admit: 2019-11-25 | Discharge: 2019-11-25 | Disposition: A | Discharge status: Home / Self Care | Payer: Medicare Other | Source: Ambulatory Visit | Attending: Radiation Oncology | Admitting: Radiation Oncology

## 2019-11-25 ENCOUNTER — Encounter: Payer: Self-pay | Admitting: Radiation Oncology

## 2019-11-25 ENCOUNTER — Other Ambulatory Visit: Payer: Self-pay

## 2019-11-25 ENCOUNTER — Inpatient Hospital Stay: Payer: Medicare Other | Attending: Oncology | Admitting: Oncology

## 2019-11-25 VITALS — BP 142/74 | HR 70 | Temp 97.6°F | Wt 110.6 lb

## 2019-11-25 VITALS — BP 142/74 | HR 70 | Temp 97.6°F | Resp 20 | Wt 110.6 lb

## 2019-11-25 DIAGNOSIS — Z79810 Long term (current) use of selective estrogen receptor modulators (SERMs): Secondary | ICD-10-CM | POA: Diagnosis not present

## 2019-11-25 DIAGNOSIS — K219 Gastro-esophageal reflux disease without esophagitis: Secondary | ICD-10-CM | POA: Insufficient documentation

## 2019-11-25 DIAGNOSIS — I341 Nonrheumatic mitral (valve) prolapse: Secondary | ICD-10-CM | POA: Insufficient documentation

## 2019-11-25 DIAGNOSIS — Z791 Long term (current) use of non-steroidal anti-inflammatories (NSAID): Secondary | ICD-10-CM | POA: Diagnosis not present

## 2019-11-25 DIAGNOSIS — Z79899 Other long term (current) drug therapy: Secondary | ICD-10-CM | POA: Insufficient documentation

## 2019-11-25 DIAGNOSIS — D0511 Intraductal carcinoma in situ of right breast: Secondary | ICD-10-CM | POA: Diagnosis not present

## 2019-11-25 DIAGNOSIS — Z9011 Acquired absence of right breast and nipple: Secondary | ICD-10-CM | POA: Diagnosis not present

## 2019-11-25 DIAGNOSIS — I10 Essential (primary) hypertension: Secondary | ICD-10-CM | POA: Diagnosis not present

## 2019-11-25 DIAGNOSIS — Z8616 Personal history of COVID-19: Secondary | ICD-10-CM | POA: Diagnosis not present

## 2019-11-25 DIAGNOSIS — Z170 Estrogen receptor positive status [ER+]: Secondary | ICD-10-CM | POA: Diagnosis not present

## 2019-11-25 DIAGNOSIS — E039 Hypothyroidism, unspecified: Secondary | ICD-10-CM | POA: Insufficient documentation

## 2019-11-25 DIAGNOSIS — E785 Hyperlipidemia, unspecified: Secondary | ICD-10-CM | POA: Diagnosis not present

## 2019-11-25 NOTE — Progress Notes (Signed)
Patient reports no pain or concerns at 6 month follow up today.

## 2019-11-25 NOTE — Progress Notes (Signed)
Radiation Oncology Follow up Note  Name: Chelsea Peters   Date:   11/25/2019 MRN:  [id number] DOB: 1940-05-10    This 79 y.o. female presents to the clinic today for 61-month follow-up status post whole breast radiation to right breast for ER positive ductal carcinoma in situ  REFERRING PROVIDER: Rusty Aus, MD  HPI: Patient is a 79 year old female now at 6 months having completed whole breast radiation to her right breast for ER positive ductal carcinoma in situ seen today in routine follow-up she is doing well.  She specifically denies breast tenderness cough or bone pain..  She is currently on tamoxifen tolerating it well without side effect.  She is scheduled for follow-up mammograms in October.  COMPLICATIONS OF TREATMENT: none  FOLLOW UP COMPLIANCE: keeps appointments   PHYSICAL EXAM:  BP (!) 142/74 (BP Location: Left Arm, Patient Position: Sitting, Cuff Size: Normal)   Pulse 70   Temp 97.6 F (36.4 C) (Tympanic)   Wt 110 lb 9.6 oz (50.2 kg)   BMI 20.56 kg/m  Lungs are clear to A&P cardiac examination essentially unremarkable with regular rate and rhythm. No dominant mass or nodularity is noted in either breast in 2 positions examined. Incision is well-healed. No axillary or supraclavicular adenopathy is appreciated. Cosmetic result is excellent.  Well-developed well-nourished patient in NAD. HEENT reveals PERLA, EOMI, discs not visualized.  Oral cavity is clear. No oral mucosal lesions are identified. Neck is clear without evidence of cervical or supraclavicular adenopathy. Lungs are clear to A&P. Cardiac examination is essentially unremarkable with regular rate and rhythm without murmur rub or thrill. Abdomen is benign with no organomegaly or masses noted. Motor sensory and DTR levels are equal and symmetric in the upper and lower extremities. Cranial nerves II through XII are grossly intact. Proprioception is intact. No peripheral adenopathy or edema is identified. No motor or  sensory levels are noted. Crude visual fields are within normal range.  RADIOLOGY RESULTS: No current films to review  PLAN: Present time patient is doing well 6 months out from whole breast radiation and pleased with her overall progress.  She continues on tamoxifen without side effect.  I have asked to see her back in 6 months for follow-up.  Patient knows to call with any concerns.  I have asked her to forward a copy of her October mammograms for my review.  I would like to take this opportunity to thank you for allowing me to participate in the care of your patient.Noreene Filbert, MD

## 2019-12-24 ENCOUNTER — Ambulatory Visit
Admission: RE | Admit: 2019-12-24 | Discharge: 2019-12-24 | Disposition: A | Discharge status: Home / Self Care | Payer: Medicare Other | Source: Ambulatory Visit | Attending: Surgery | Admitting: Surgery

## 2019-12-24 ENCOUNTER — Other Ambulatory Visit: Payer: Self-pay

## 2019-12-24 DIAGNOSIS — Z853 Personal history of malignant neoplasm of breast: Secondary | ICD-10-CM

## 2019-12-24 HISTORY — DX: Personal history of irradiation: Z92.3

## 2020-04-25 ENCOUNTER — Other Ambulatory Visit: Payer: Self-pay | Admitting: Oncology

## 2020-05-07 DIAGNOSIS — E782 Mixed hyperlipidemia: Secondary | ICD-10-CM | POA: Diagnosis not present

## 2020-05-07 DIAGNOSIS — E039 Hypothyroidism, unspecified: Secondary | ICD-10-CM | POA: Diagnosis not present

## 2020-05-14 DIAGNOSIS — Z0000 Encounter for general adult medical examination without abnormal findings: Secondary | ICD-10-CM | POA: Diagnosis not present

## 2020-05-14 DIAGNOSIS — F32A Depression, unspecified: Secondary | ICD-10-CM | POA: Diagnosis not present

## 2020-05-14 DIAGNOSIS — L570 Actinic keratosis: Secondary | ICD-10-CM | POA: Diagnosis not present

## 2020-05-14 DIAGNOSIS — M778 Other enthesopathies, not elsewhere classified: Secondary | ICD-10-CM | POA: Diagnosis not present

## 2020-05-24 ENCOUNTER — Other Ambulatory Visit: Payer: Self-pay

## 2020-05-24 ENCOUNTER — Inpatient Hospital Stay: Payer: Medicare HMO | Attending: Oncology | Admitting: Oncology

## 2020-05-24 ENCOUNTER — Ambulatory Visit
Admission: RE | Admit: 2020-05-24 | Discharge: 2020-05-24 | Disposition: A | Discharge status: Home / Self Care | Payer: Medicare HMO | Source: Ambulatory Visit | Attending: Radiation Oncology | Admitting: Radiation Oncology

## 2020-05-24 VITALS — BP 149/73 | HR 67 | Temp 97.9°F | Resp 20 | Wt 114.0 lb

## 2020-05-24 DIAGNOSIS — Z08 Encounter for follow-up examination after completed treatment for malignant neoplasm: Secondary | ICD-10-CM | POA: Diagnosis not present

## 2020-05-24 DIAGNOSIS — Z79810 Long term (current) use of selective estrogen receptor modulators (SERMs): Secondary | ICD-10-CM | POA: Insufficient documentation

## 2020-05-24 DIAGNOSIS — T50905A Adverse effect of unspecified drugs, medicaments and biological substances, initial encounter: Secondary | ICD-10-CM | POA: Diagnosis not present

## 2020-05-24 DIAGNOSIS — Z170 Estrogen receptor positive status [ER+]: Secondary | ICD-10-CM | POA: Insufficient documentation

## 2020-05-24 DIAGNOSIS — F32A Depression, unspecified: Secondary | ICD-10-CM | POA: Diagnosis not present

## 2020-05-24 DIAGNOSIS — Z79899 Other long term (current) drug therapy: Secondary | ICD-10-CM | POA: Insufficient documentation

## 2020-05-24 DIAGNOSIS — Z9011 Acquired absence of right breast and nipple: Secondary | ICD-10-CM | POA: Diagnosis not present

## 2020-05-24 DIAGNOSIS — D0511 Intraductal carcinoma in situ of right breast: Secondary | ICD-10-CM | POA: Insufficient documentation

## 2020-05-24 DIAGNOSIS — R232 Flushing: Secondary | ICD-10-CM | POA: Insufficient documentation

## 2020-05-24 DIAGNOSIS — Z923 Personal history of irradiation: Secondary | ICD-10-CM | POA: Diagnosis not present

## 2020-05-24 DIAGNOSIS — Z791 Long term (current) use of non-steroidal anti-inflammatories (NSAID): Secondary | ICD-10-CM | POA: Insufficient documentation

## 2020-05-24 DIAGNOSIS — Z8616 Personal history of COVID-19: Secondary | ICD-10-CM | POA: Diagnosis not present

## 2020-05-24 NOTE — Progress Notes (Signed)
Radiation Oncology Follow up Note  Name: Chelsea Peters   Date:   05/24/2020 MRN:  [id number] DOB: 1940-09-16    [name] presents to the clinic today for 1 year follow-up status post whole breast radiation to right breast for ER positive ductal carcinoma in situ.  REFERRING PROVIDER: Rusty Aus, MD  HPI: Patient is a 80 year old female now at 1 year having completed whole breast radiation to her right breast for ER positive ductal carcinoma in situ.  Seen today in routine follow-up she is doing well.  She specifically denies breast tenderness cough or bone pain.  She does occasionally have some shooting pains in the right breast most likely related to scarring..  She is currently on tamoxifen tolerating it well without side effect.  She had mammograms back in October which I have reviewed were BI-RADS 2 benign.  COMPLICATIONS OF TREATMENT: none  FOLLOW UP COMPLIANCE: keeps appointments   PHYSICAL EXAM:  There were no vitals taken for this visit. Lungs are clear to A&P cardiac examination essentially unremarkable with regular rate and rhythm. No dominant mass or nodularity is noted in either breast in 2 positions examined. Incision is well-healed. No axillary or supraclavicular adenopathy is appreciated. Cosmetic result is excellent.  Well-developed well-nourished patient in NAD. HEENT reveals PERLA, EOMI, discs not visualized.  Oral cavity is clear. No oral mucosal lesions are identified. Neck is clear without evidence of cervical or supraclavicular adenopathy. Lungs are clear to A&P. Cardiac examination is essentially unremarkable with regular rate and rhythm without murmur rub or thrill. Abdomen is benign with no organomegaly or masses noted. Motor sensory and DTR levels are equal and symmetric in the upper and lower extremities. Cranial nerves II through XII are grossly intact. Proprioception is intact. No peripheral adenopathy or edema is identified. No motor or sensory levels  are noted. Crude visual fields are within normal range.  RADIOLOGY RESULTS: Mammograms reviewed compatible with above-stated findings  PLAN: Present time patient is doing well with no evidence of disease 1 year out I am pleased with her overall progress.  She continues on tamoxifen without side effect.  I have asked to see her back in 1 year for follow-up.  Patient knows to call with any concerns.  I would like to take this opportunity to thank you for allowing me to participate in the care of your patient.Noreene Filbert, MD

## 2020-05-24 NOTE — Progress Notes (Signed)
Patient denies any concerns today.  

## 2020-05-24 NOTE — Progress Notes (Signed)
[place]  [phone] [phone]) [phone]  ID: Otis Dials OB: 1940-08-24  MR#: [id number]  RFF#:[id number]  Patient Care Team: Rusty Aus, MD as PCP - General (Internal Medicine) Benjamine Sprague, DO as Consulting Physician (Surgery) Rico Junker, RN as Oncology Nurse Navigator Grayland Ormond, Kathlene November, MD as Consulting Physician (Oncology) Noreene Filbert, MD as Referring Physician (Radiation Oncology)  CHIEF COMPLAINT: DCIS, right breast  INTERVAL HISTORY: Patient returns to clinic today for routine 58-monthevaluation.  She was last seen in clinic on 11/25/2019.   In the interim, she has done well.  Denies any recent hospitalizations.  She is currently stable on tamoxifen.  She has occasional hot flashes at bedtime but otherwise states these are better.  She takes her tamoxifen at bedtime as well.  She has chronic depression that is fairly stable. She has no neurologic complaints.  She denies any recent fevers or illnesses.  She has a good appetite and denies weight loss.  She has no chest pain, shortness of breath, cough, or hemoptysis.  She denies any nausea, vomiting, constipation, or diarrhea.  She has no urinary complaints.  Patient feels at her baseline and offers no further specific complaints today.  REVIEW OF SYSTEMS: In Review of Systems  Constitutional: Negative.  Negative for fever and malaise/fatigue.  Respiratory: Negative.  Negative for cough, hemoptysis and shortness of breath.   Cardiovascular: Negative.  Negative for chest pain and leg swelling.  Gastrointestinal: Negative.  Negative for abdominal pain.  Genitourinary: Negative.  Negative for dysuria.  Musculoskeletal: Negative.  Negative for back pain.  Skin: Negative.  Negative for rash.  Neurological: Positive for sensory change. Negative for dizziness, focal weakness, weakness and headaches.  Psychiatric/Behavioral: Negative.  The patient is not nervous/anxious.     As per  HPI. Otherwise, a complete review of systems is negative.  PAST MEDICAL HISTORY: Past Medical History:  Diagnosis Date  . Breast cancer ([facility] 01/2019   right breast ca DCIS  . COVID-19 09/2018  . GERD (gastroesophageal reflux disease)   . Hyperlipidemia   . Hypertension   . Hypothyroidism   . Lumbar disc disease   . MVP (mitral valve prolapse)   . Personal history of radiation therapy    right breast ca. Finished 3/021  . Pneumonia 09/2018   pt states she was positive for covid then as well    PAST SURGICAL HISTORY: Past Surgical History:  Procedure Laterality Date  . ABDOMINAL HYSTERECTOMY    . BREAST BIOPSY Right 01/08/2019   Affirm Biopsy- X-Clip- DCIS  . BREAST LUMPECTOMY Right 02/13/2019   DCIS, negative LN,  close margins of 1 mm lateral  . CARDIAC CATHETERIZATION N/A 06/02/2015   Procedure: Left Heart Cath and Coronary Angiography;  Surgeon: KTeodoro Spray MD;  [facility];  Service: Cardiovascular;  Laterality: N/A;  . COLONOSCOPY WITH PROPOFOL N/A 01/27/2019   Procedure: COLONOSCOPY WITH PROPOFOL;  Surgeon: Toledo, TBenay Pike MD;  Location: [facility];  Service: Gastroenterology;  Laterality: N/A;  . ESOPHAGOGASTRODUODENOSCOPY (EGD) WITH PROPOFOL N/A 01/27/2019   Procedure: ESOPHAGOGASTRODUODENOSCOPY (EGD) WITH PROPOFOL;  Surgeon: Toledo, TBenay Pike MD;  Location: [facility];  Service: Gastroenterology;  Laterality: N/A;  . PARTIAL MASTECTOMY WITH AXILLARY SENTINEL LYMPH NODE BIOPSY Right 02/13/2019   Procedure: PARTIAL MASTECTOMY WITH AXILLARY SENTINEL LYMPH NODE BIOPSY;  Surgeon: SBenjamine Sprague DO;  Location: [facility];  Service: General;  Laterality: Right;  . STOMACH SURGERY    . THYROID SURGERY  FAMILY HISTORY: Family History  Problem Relation Age of Onset  . Breast cancer Mother 55  . Breast cancer Maternal Aunt   . Breast cancer Maternal Aunt   . Stomach cancer Other   . Throat cancer Other     ADVANCED DIRECTIVES (Y/N):   N  HEALTH MAINTENANCE: Social History   Tobacco Use  . Smoking status: Never Smoker  . Smokeless tobacco: Never Used  Vaping Use  . Vaping Use: Never used  Substance Use Topics  . Alcohol use: Not Currently  . Drug use: No     Colonoscopy:  PAP:  Bone density:  Lipid panel:  Allergies  Allergen Reactions  . Ambien [Zolpidem] Other (See Comments)    erratic behavior/"made me crazy"  . Latex Itching    redness  . Paxil [Paroxetine] Other (See Comments)    Stomach pains.  . Penicillins Other (See Comments)    Blisters in nose Did it involve swelling of the face/tongue/throat, SOB, or low BP? No Did it involve sudden or severe rash/hives, skin peeling, or any reaction on the inside of your mouth or nose? Yes--blisters in nose Did you need to seek medical attention at a hospital or doctor's office? No When did it last happen?More than 10 years ago If all above answers are "NO", may proceed with cephalosporin use.     Current Outpatient Medications  Medication Sig Dispense Refill  . ALPRAZolam (XANAX) 0.25 MG tablet Take 0.25 mg by mouth daily as needed for anxiety.    Marland Kitchen amLODipine (NORVASC) 5 MG tablet Take 5 mg by mouth every morning.   3  . Biotin 1 MG CAPS Take 1 mg by mouth daily.    . Cholecalciferol 50 MCG (2000 UT) TABS Take 2,000 Units by mouth daily.     . DULoxetine (CYMBALTA) 30 MG capsule Take 30 mg by mouth daily. Reported on 06/02/2015  0  . FLUoxetine (PROZAC) 20 MG capsule Take 20 mg by mouth daily.    Marland Kitchen levothyroxine (SYNTHROID, LEVOTHROID) 25 MCG tablet Take 25 mcg by mouth daily before breakfast.     . omeprazole (PRILOSEC) 20 MG capsule Take 20 mg by mouth every morning.   0  . tamoxifen (NOLVADEX) 20 MG tablet TAKE 1 TABLET(20 MG) BY MOUTH DAILY 90 tablet 3   No current facility-administered medications for this visit.    OBJECTIVE: Vitals:   05/24/20 1120  BP: (!) 149/73  Pulse: 67  Resp: 20  Temp: 97.9 F (36.6 C)  SpO2: 98%     Body  mass index is 21.19 kg/m.    ECOG FS:0 - Asymptomatic  Physical Exam Constitutional:      Appearance: Normal appearance.  HENT:     Head: Normocephalic and atraumatic.  Eyes:     Pupils: Pupils are equal, round, and reactive to light.  Cardiovascular:     Rate and Rhythm: Normal rate and regular rhythm.     Heart sounds: Normal heart sounds. No murmur heard.   Pulmonary:     Effort: Pulmonary effort is normal.     Breath sounds: Normal breath sounds. No wheezing.  Abdominal:     General: Bowel sounds are normal. There is no distension.     Palpations: Abdomen is soft.     Tenderness: There is no abdominal tenderness.  Musculoskeletal:        General: Normal range of motion.     Cervical back: Normal range of motion.  Skin:    General: Skin is  warm and dry.     Findings: No rash.  Neurological:     Mental Status: She is alert and oriented to person, place, and time.  Psychiatric:        Judgment: Judgment normal.     LAB RESULTS:  Lab Results  Component Value Date   NA 136 10/05/2018   K 3.8 10/05/2018   CL 100 10/05/2018   CO2 27 10/05/2018   GLUCOSE 88 10/05/2018   BUN 25 (H) 10/05/2018   CREATININE 0.79 10/05/2018   CALCIUM 9.0 10/05/2018   PROT 7.1 05/19/2015   ALBUMIN 4.1 05/19/2015   AST 31 05/19/2015   ALT 17 05/19/2015   ALKPHOS 61 05/19/2015   BILITOT 0.6 05/19/2015   GFRNONAA >60 10/05/2018   GFRAA >60 10/05/2018    Lab Results  Component Value Date   WBC 4.5 04/24/2019   NEUTROABS 2.9 05/19/2015   HGB 15.1 (H) 04/24/2019   HCT 45.6 04/24/2019   MCV 93.8 04/24/2019   PLT 243 04/24/2019     STUDIES: No results found.  ASSESSMENT: DCIS, right breast  PLAN:    1. DCIS, right breast:  -Status post lumpectomy on 02/13/2019 with close margins of 1 mm.  She did not require reexcision. -She proceeded directly with adjuvant XRT completing in March 2021. -She was started on tamoxifen which she will complete in March 2026. -Most recent  mammogram from 12/24/2019 was reported as BI-RADS Category 2 benign. -Repeat in October 2022. -RTC after mammogram for follow-up.  2.  Depression: -She is followed by her PCP. -Stable on Prozac.  3.  Hot flashes: -Secondary to tamoxifen. -I have asked her to take her tamoxifen in the morning instead of at bedtime to see if this helps with her hot flashes. -Patient is agreeable.  Disposition: -Mammogram in October 2022. -RTC after mammogram for follow-up.  Greater than 50% was spent in counseling and coordination of care with this patient including but not limited to discussion of the relevant topics above (See A&P) including, but not limited to diagnosis and management of acute and chronic medical conditions.   Patient expressed understanding and was in agreement with this plan. She also understands that She can call clinic at any time with any questions, concerns, or complaints.   Cancer Staging Ductal carcinoma in situ (DCIS) of right breast Staging form: Breast, AJCC 8th Edition - Clinical stage from 01/16/2019: Stage 0 (cTis (DCIS), cN0, cM0, ER+, PR+, HER2-) - Signed by Lloyd Huger, MD on 01/16/2019 Stage prefix: Initial diagnosis   Jacquelin Hawking, NP   05/24/2020 12:45 PM

## 2020-08-02 ENCOUNTER — Emergency Department
Admission: EM | Admit: 2020-08-02 | Discharge: 2020-08-03 | Disposition: A | Discharge status: Home / Self Care | Payer: Medicare HMO | Attending: Emergency Medicine | Admitting: Emergency Medicine

## 2020-08-02 ENCOUNTER — Other Ambulatory Visit: Payer: Self-pay

## 2020-08-02 DIAGNOSIS — R1110 Vomiting, unspecified: Secondary | ICD-10-CM | POA: Diagnosis not present

## 2020-08-02 DIAGNOSIS — Z5321 Procedure and treatment not carried out due to patient leaving prior to being seen by health care provider: Secondary | ICD-10-CM | POA: Insufficient documentation

## 2020-08-02 LAB — CBC
HCT: 39.7 % (ref 36.0–46.0)
Hemoglobin: 13.8 g/dL (ref 12.0–15.0)
MCH: 32.6 pg (ref 26.0–34.0)
MCHC: 34.8 g/dL (ref 30.0–36.0)
MCV: 93.9 fL (ref 80.0–100.0)
Platelets: 226 10*3/uL (ref 150–400)
RBC: 4.23 MIL/uL (ref 3.87–5.11)
RDW: 13.2 % (ref 11.5–15.5)
WBC: 9.5 10*3/uL (ref 4.0–10.5)
nRBC: 0 % (ref 0.0–0.2)

## 2020-08-02 LAB — URINALYSIS, COMPLETE (UACMP) WITH MICROSCOPIC
Bacteria, UA: NONE SEEN
Bilirubin Urine: NEGATIVE
Glucose, UA: NEGATIVE mg/dL
Hgb urine dipstick: NEGATIVE
Ketones, ur: NEGATIVE mg/dL
Nitrite: NEGATIVE
Protein, ur: NEGATIVE mg/dL
Specific Gravity, Urine: 1.021 (ref 1.005–1.030)
pH: 5 (ref 5.0–8.0)

## 2020-08-02 LAB — COMPREHENSIVE METABOLIC PANEL
ALT: 18 U/L (ref 0–44)
AST: 24 U/L (ref 15–41)
Albumin: 4 g/dL (ref 3.5–5.0)
Alkaline Phosphatase: 47 U/L (ref 38–126)
Anion gap: 11 (ref 5–15)
BUN: 19 mg/dL (ref 8–23)
CO2: 27 mmol/L (ref 22–32)
Calcium: 9.2 mg/dL (ref 8.9–10.3)
Chloride: 103 mmol/L (ref 98–111)
Creatinine, Ser: 0.87 mg/dL (ref 0.44–1.00)
GFR, Estimated: >60 mL/min (ref >60)
Glucose, Bld: 117 mg/dL — ABNORMAL HIGH (ref 70–99)
Potassium: 3.9 mmol/L (ref 3.5–5.1)
Sodium: 141 mmol/L (ref 135–145)
Total Bilirubin: 0.6 mg/dL (ref 0.3–1.2)
Total Protein: 6.7 g/dL (ref 6.5–8.1)

## 2020-08-02 LAB — LIPASE, BLOOD: Lipase: 24 U/L (ref 11–51)

## 2020-08-02 MED ORDER — ONDANSETRON 4 MG PO TBDP
4.0000 mg | ORAL_TABLET | Freq: Once | ORAL | Status: AC | PRN
  Administered 2020-08-02: 4 mg via ORAL
  Filled 2020-08-02: qty 1

## 2020-08-02 NOTE — ED Triage Notes (Signed)
Pt reports she received the covid booster yesterday and started with heartburn, states tonight she started vomiting around 1900 tonight, states that she has vomited every few minutes, states that that last few times when she vomited she lost her breath. Denies diarrhea or having been around anyone else who has been sick

## 2020-11-08 DIAGNOSIS — E559 Vitamin D deficiency, unspecified: Secondary | ICD-10-CM | POA: Diagnosis not present

## 2020-11-08 DIAGNOSIS — E782 Mixed hyperlipidemia: Secondary | ICD-10-CM | POA: Diagnosis not present

## 2020-11-15 DIAGNOSIS — K649 Unspecified hemorrhoids: Secondary | ICD-10-CM | POA: Diagnosis not present

## 2020-11-15 DIAGNOSIS — F419 Anxiety disorder, unspecified: Secondary | ICD-10-CM | POA: Diagnosis not present

## 2020-11-15 DIAGNOSIS — Z1389 Encounter for screening for other disorder: Secondary | ICD-10-CM | POA: Diagnosis not present

## 2020-11-15 DIAGNOSIS — Z0000 Encounter for general adult medical examination without abnormal findings: Secondary | ICD-10-CM | POA: Diagnosis not present

## 2020-11-15 DIAGNOSIS — E559 Vitamin D deficiency, unspecified: Secondary | ICD-10-CM | POA: Diagnosis not present

## 2020-11-15 DIAGNOSIS — F411 Generalized anxiety disorder: Secondary | ICD-10-CM | POA: Diagnosis not present

## 2020-11-15 DIAGNOSIS — Z23 Encounter for immunization: Secondary | ICD-10-CM | POA: Diagnosis not present

## 2020-11-15 DIAGNOSIS — Z86010 Personal history of colonic polyps: Secondary | ICD-10-CM | POA: Diagnosis not present

## 2020-11-15 DIAGNOSIS — R198 Other specified symptoms and signs involving the digestive system and abdomen: Secondary | ICD-10-CM | POA: Diagnosis not present

## 2020-11-15 DIAGNOSIS — K5909 Other constipation: Secondary | ICD-10-CM | POA: Diagnosis not present

## 2020-12-24 ENCOUNTER — Inpatient Hospital Stay: Admission: RE | Admit: 2020-12-24 | Payer: Medicare HMO | Source: Ambulatory Visit

## 2020-12-27 ENCOUNTER — Inpatient Hospital Stay: Payer: Medicare HMO | Attending: Oncology | Admitting: Oncology

## 2020-12-27 NOTE — Progress Notes (Deleted)
[place]  Telephone:(336) [phone] Fax:(336) [phone]  ID: Chelsea Peters OB: 1940-05-06  MR#: [id number]  JJK#:[id number]  Patient Care Team: Rusty Aus, MD as PCP - General (Internal Medicine) Benjamine Sprague, DO as Consulting Physician (Surgery) Rico Junker, RN as Oncology Nurse Navigator Grayland Ormond, Kathlene November, MD as Consulting Physician (Oncology) Noreene Filbert, MD as Referring Physician (Radiation Oncology)  CHIEF COMPLAINT: DCIS, right breast  INTERVAL HISTORY: Patient returns to clinic today for routine 7-monthevaluation.  She has a past medical history significant for hypertension, hyperlipidemia, depression and DCIS who is followed by Dr. FGrayland Ormond  She is currently on tamoxifen and tolerating medication well.  Her last mammogram was on 12/24/2019 and was BI-RADS Category 2 benign.  She is due for repeat mammogram which is scheduled for 12/30/2020.  In the interim, she has done well.  She was evaluated at an urgent care on 08/02/2020 for nausea and vomiting.  She was given Zofran and was discharged home.  Lab work was unremarkable.  She continues to have occasional hot flashes but they are tolerable.  REVIEW OF SYSTEMS: In Review of Systems  All other systems reviewed and are negative.  As per HPI. Otherwise, a complete review of systems is negative.  PAST MEDICAL HISTORY: Past Medical History:  Diagnosis Date   [place] [place]   right breast ca DCIS   COVID-19 09/2018   GERD (gastroesophageal reflux disease)    Hyperlipidemia    Hypertension    Hypothyroidism    Lumbar disc disease    MVP (mitral valve prolapse)    Personal history of radiation therapy    right breast ca. Finished 3/021   Pneumonia 09/2018   pt states she was positive for covid then as well    PAST SURGICAL HISTORY: Past Surgical History:  Procedure Laterality Date   ABDOMINAL HYSTERECTOMY     BREAST BIOPSY Right 01/08/2019   Affirm Biopsy- X-Clip- DCIS    BREAST LUMPECTOMY Right 02/13/2019   DCIS, negative LN,  close margins of 1 mm lateral   CARDIAC CATHETERIZATION N/A 06/02/2015   Procedure: Left Heart Cath and Coronary Angiography;  Surgeon: KTeodoro Spray MD;  [facility];  Service: Cardiovascular;  Laterality: N/A;   COLONOSCOPY WITH PROPOFOL N/A 01/27/2019   Procedure: COLONOSCOPY WITH PROPOFOL;  Surgeon: Toledo, TBenay Pike MD;  Location: [facility];  Service: Gastroenterology;  Laterality: N/A;   ESOPHAGOGASTRODUODENOSCOPY (EGD) WITH PROPOFOL N/A 01/27/2019   Procedure: ESOPHAGOGASTRODUODENOSCOPY (EGD) WITH PROPOFOL;  Surgeon: Toledo, TBenay Pike MD;  Location: [facility];  Service: Gastroenterology;  Laterality: N/A;   PARTIAL MASTECTOMY WITH AXILLARY SENTINEL LYMPH NODE BIOPSY Right 02/13/2019   Procedure: PARTIAL MASTECTOMY WITH AXILLARY SENTINEL LYMPH NODE BIOPSY;  Surgeon: SBenjamine Sprague DO;  Location: [facility];  Service: General;  Laterality: Right;   STOMACH SURGERY     THYROID SURGERY      FAMILY HISTORY: Family History  Problem Relation Age of Onset   Breast cancer Mother 872  Breast cancer Maternal Aunt    Breast cancer Maternal Aunt    Stomach cancer Other    Throat cancer Other     ADVANCED DIRECTIVES (Y/N):  N  HEALTH MAINTENANCE: Social History   Tobacco Use   Smoking status: Never   Smokeless tobacco: Never  Vaping Use   Vaping Use: Never used  Substance Use Topics   Alcohol use: Not Currently   Drug use: No     Colonoscopy:  PAP:  Bone density:  Lipid panel:  Allergies  Allergen Reactions   Ambien [Zolpidem] Other (See Comments)    erratic behavior/"made me crazy"   Latex Itching    redness   Paxil [Paroxetine] Other (See Comments)    Stomach pains.   Penicillins Other (See Comments)    Blisters in nose Did it involve swelling of the face/tongue/throat, SOB, or low BP? No Did it involve sudden or severe rash/hives, skin peeling, or any reaction on the inside of  your mouth or nose? Yes--blisters in nose Did you need to seek medical attention at a hospital or doctor's office? No When did it last happen? More than 10 years ago    If all above answers are "NO", may proceed with cephalosporin use.     Current Outpatient Medications  Medication Sig Dispense Refill   ALPRAZolam (XANAX) 0.25 MG tablet Take 0.25 mg by mouth daily as needed for anxiety.     amLODipine (NORVASC) 5 MG tablet Take 5 mg by mouth every morning.   3   Biotin 1 MG CAPS Take 1 mg by mouth daily.     Cholecalciferol 50 MCG (2000 UT) TABS Take 2,000 Units by mouth daily.      DULoxetine (CYMBALTA) 30 MG capsule Take 30 mg by mouth daily. Reported on 06/02/2015  0   FLUoxetine (PROZAC) 20 MG capsule Take 20 mg by mouth daily.     levothyroxine (SYNTHROID, LEVOTHROID) 25 MCG tablet Take 25 mcg by mouth daily before breakfast.      omeprazole (PRILOSEC) 20 MG capsule Take 20 mg by mouth every morning.   0   tamoxifen (NOLVADEX) 20 MG tablet TAKE 1 TABLET(20 MG) BY MOUTH DAILY 90 tablet 3   No current facility-administered medications for this visit.    OBJECTIVE: There were no vitals filed for this visit.    There is no height or weight on file to calculate BMI.    ECOG FS:0 - Asymptomatic  Physical Exam Constitutional:      Appearance: Normal appearance.  HENT:     Head: Normocephalic and atraumatic.  Eyes:     Pupils: Pupils are equal, round, and reactive to light.  Cardiovascular:     Rate and Rhythm: Normal rate and regular rhythm.     Heart sounds: Normal heart sounds. No murmur heard. Pulmonary:     Effort: Pulmonary effort is normal.     Breath sounds: Normal breath sounds. No wheezing.  Abdominal:     General: Bowel sounds are normal. There is no distension.     Palpations: Abdomen is soft.     Tenderness: There is no abdominal tenderness.  Musculoskeletal:        General: Normal range of motion.     Cervical back: Normal range of motion.  Skin:    General:  Skin is warm and dry.     Findings: No rash.  Neurological:     Mental Status: She is alert and oriented to person, place, and time.  Psychiatric:        Judgment: Judgment normal.    LAB RESULTS:  Lab Results  Component Value Date   NA 141 08/02/2020   K 3.9 08/02/2020   CL 103 08/02/2020   CO2 27 08/02/2020   GLUCOSE 117 (H) 08/02/2020   BUN 19 08/02/2020   CREATININE 0.87 08/02/2020   CALCIUM 9.2 08/02/2020   PROT 6.7 08/02/2020   ALBUMIN 4.0 08/02/2020   AST 24 08/02/2020   ALT 18 08/02/2020  ALKPHOS 47 08/02/2020   BILITOT 0.6 08/02/2020   GFRNONAA >60 08/02/2020   GFRAA >60 10/05/2018    Lab Results  Component Value Date   WBC 9.5 08/02/2020   NEUTROABS 2.9 05/19/2015   HGB 13.8 08/02/2020   HCT 39.7 08/02/2020   MCV 93.9 08/02/2020   PLT 226 08/02/2020     STUDIES: No results found.  ASSESSMENT: DCIS, right breast  PLAN:    1. DCIS, right breast:  -Status post lumpectomy on 02/13/2019 with close margins of 1 mm.  She did not require reexcision. -She proceeded directly with adjuvant XRT completing in March 2021. -She was started on tamoxifen which she will complete in March 2026. -Most recent mammogram from 12/24/2019 was reported as BI-RADS Category 2 benign. -Repeat in October 2022. -RTC after mammogram for follow-up.  2.  Depression: -She is followed by her PCP. -Stable on Prozac.  3.  Hot flashes: -Secondary to tamoxifen. -I have asked her to take her tamoxifen in the morning instead of at bedtime to see if this helps with her hot flashes. -Patient is agreeable.  Disposition: -Mammogram in October 2022. -RTC after mammogram for follow-up.  Greater than 50% was spent in counseling and coordination of care with this patient including but not limited to discussion of the relevant topics above (See A&P) including, but not limited to diagnosis and management of acute and chronic medical conditions.   Patient expressed understanding and was  in agreement with this plan. She also understands that She can call clinic at any time with any questions, concerns, or complaints.   Cancer Staging Ductal carcinoma in situ (DCIS) of right breast Staging form: Breast, AJCC 8th Edition - Clinical stage from 01/16/2019: Stage 0 (cTis (DCIS), cN0, cM0, ER+, PR+, HER2-) - Signed by Lloyd Huger, MD on 01/16/2019 Stage prefix: Initial diagnosis   Jacquelin Hawking, NP   12/27/2020 10:13 AM

## 2020-12-30 ENCOUNTER — Ambulatory Visit
Admission: RE | Admit: 2020-12-30 | Discharge: 2020-12-30 | Disposition: A | Discharge status: Home / Self Care | Payer: Medicare HMO | Source: Ambulatory Visit | Attending: Oncology | Admitting: Oncology

## 2020-12-30 ENCOUNTER — Other Ambulatory Visit: Payer: Self-pay

## 2020-12-30 ENCOUNTER — Other Ambulatory Visit: Payer: Self-pay | Admitting: Oncology

## 2020-12-30 DIAGNOSIS — R928 Other abnormal and inconclusive findings on diagnostic imaging of breast: Secondary | ICD-10-CM

## 2020-12-30 DIAGNOSIS — N6489 Other specified disorders of breast: Secondary | ICD-10-CM

## 2020-12-30 DIAGNOSIS — D0511 Intraductal carcinoma in situ of right breast: Secondary | ICD-10-CM | POA: Insufficient documentation

## 2020-12-30 DIAGNOSIS — R922 Inconclusive mammogram: Secondary | ICD-10-CM | POA: Diagnosis not present

## 2021-01-06 ENCOUNTER — Ambulatory Visit
Admission: RE | Admit: 2021-01-06 | Discharge: 2021-01-06 | Disposition: A | Discharge status: Home / Self Care | Payer: Medicare HMO | Source: Ambulatory Visit | Attending: Oncology | Admitting: Oncology

## 2021-01-06 ENCOUNTER — Other Ambulatory Visit: Payer: Self-pay

## 2021-01-06 DIAGNOSIS — R928 Other abnormal and inconclusive findings on diagnostic imaging of breast: Secondary | ICD-10-CM | POA: Diagnosis not present

## 2021-01-06 DIAGNOSIS — N6489 Other specified disorders of breast: Secondary | ICD-10-CM

## 2021-01-06 DIAGNOSIS — N6032 Fibrosclerosis of left breast: Secondary | ICD-10-CM | POA: Diagnosis not present

## 2021-01-06 DIAGNOSIS — N641 Fat necrosis of breast: Secondary | ICD-10-CM | POA: Diagnosis not present

## 2021-01-06 DIAGNOSIS — C50512 Malignant neoplasm of lower-outer quadrant of left female breast: Secondary | ICD-10-CM | POA: Diagnosis not present

## 2021-01-06 HISTORY — PX: BREAST BIOPSY: SHX20

## 2021-01-11 LAB — SURGICAL PATHOLOGY

## 2021-01-17 NOTE — Progress Notes (Signed)
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  INTERVAL HISTORY: Patient returns to clinic today as an add-on after recently being diagnosed with left breast cancer despite taking tamoxifen for her right breast DCIS.  She was evaluated by surgery earlier today.  She currently feels well and is asymptomatic.  She has no neurologic complaints.  She denies any recent fevers or illnesses.  She has a good appetite and denies weight loss.  She has no chest pain, shortness of breath, cough, or hemoptysis.  She denies any nausea, vomiting, constipation, or diarrhea.  She has no urinary complaints.  Patient offers no further specific complaints today.  REVIEW OF SYSTEMS:   Review of Systems  Constitutional: Negative.  Negative for fever and malaise/fatigue.  Respiratory: Negative.  Negative for cough, hemoptysis and shortness of breath.   Cardiovascular: Negative.  Negative for chest pain and leg swelling.  Gastrointestinal: Negative.  Negative for abdominal pain.  Genitourinary: Negative.  Negative for dysuria.  Musculoskeletal: Negative.  Negative for back pain.  Skin: Negative.  Negative for rash.  Neurological: Negative.  Negative for dizziness, sensory change, focal weakness, weakness and headaches.  Psychiatric/Behavioral: Negative.  The patient is not nervous/anxious.    As per HPI. Otherwise, a complete review of systems is negative.  PAST MEDICAL HISTORY: Past Medical  History:  Diagnosis Date   Breast cancer ([place]) 01/2019   right breast ca DCIS   COVID-19 09/2018   GERD (gastroesophageal reflux disease)    Hyperlipidemia    Hypertension    Hypothyroidism    Lumbar disc disease    MVP (mitral valve prolapse)    Personal history of radiation therapy    right breast ca. Finished 3/021   Pneumonia 09/2018   pt states she was positive for covid then as well    PAST SURGICAL HISTORY: Past Surgical History:  Procedure Laterality Date   ABDOMINAL HYSTERECTOMY     BREAST BIOPSY Right 01/08/2019   Affirm Biopsy- X-Clip- DCIS   BREAST BIOPSY Left 01/06/2021   stereo bx-asymmetry-"coil" clip-path pending   BREAST LUMPECTOMY Right 02/13/2019   DCIS, negative LN,  close margins of 1 mm lateral   CARDIAC CATHETERIZATION N/A 06/02/2015   Procedure: Left Heart Cath and Coronary Angiography;  Surgeon: Teodoro Spray, MD;  Location: [facility];  Service: Cardiovascular;  Laterality: N/A;   COLONOSCOPY WITH PROPOFOL N/A 01/27/2019   Procedure: COLONOSCOPY WITH PROPOFOL;  Surgeon: Toledo, Benay Pike, MD;  Location: [facility];  Service: Gastroenterology;  Laterality: N/A;   ESOPHAGOGASTRODUODENOSCOPY (EGD) WITH PROPOFOL N/A 01/27/2019   Procedure: ESOPHAGOGASTRODUODENOSCOPY (EGD) WITH PROPOFOL;  Surgeon: Toledo, Benay Pike, MD;  Location: [facility];  Service: Gastroenterology;  Laterality: N/A;   PARTIAL MASTECTOMY WITH AXILLARY SENTINEL LYMPH NODE BIOPSY Right 02/13/2019   Procedure: PARTIAL MASTECTOMY WITH AXILLARY SENTINEL LYMPH NODE BIOPSY;  Surgeon: Benjamine Sprague, DO;  Location: [facility];  Service: General;  Laterality: Right;   STOMACH SURGERY     THYROID SURGERY      FAMILY HISTORY: Family History  Problem  Relation Age of Onset   Breast cancer Mother 21   Breast cancer Maternal Aunt    Breast cancer Maternal Aunt    Stomach cancer Other    Throat cancer Other     ADVANCED DIRECTIVES (Y/N):  N  HEALTH MAINTENANCE: Social  History   Tobacco Use   Smoking status: Never   Smokeless tobacco: Never  Vaping Use   Vaping Use: Never used  Substance Use Topics   Alcohol use: Not Currently   Drug use: No     Colonoscopy:  PAP:  Bone density:  Lipid panel:  Allergies  Allergen Reactions   Ambien [Zolpidem] Other (See Comments)    erratic behavior/"made me crazy"   Latex Itching    redness   Paxil [Paroxetine] Other (See Comments)    Stomach pains.   Penicillins Other (See Comments)    Blisters in nose Did it involve swelling of the face/tongue/throat, SOB, or low BP? No Did it involve sudden or severe rash/hives, skin peeling, or any reaction on the inside of your mouth or nose? Yes--blisters in nose Did you need to seek medical attention at a hospital or doctor's office? No When did it last happen? More than 10 years ago    If all above answers are "NO", may proceed with cephalosporin use.     Current Outpatient Medications  Medication Sig Dispense Refill   ALPRAZolam (XANAX) 0.25 MG tablet Take 0.25 mg by mouth daily as needed for anxiety.     amLODipine (NORVASC) 5 MG tablet Take 5 mg by mouth every morning.   3   Biotin 1 MG CAPS Take 1 mg by mouth daily.     Cholecalciferol 50 MCG (2000 UT) TABS Take 2,000 Units by mouth daily.      omeprazole (PRILOSEC) 20 MG capsule Take 20 mg by mouth every morning.   0   tamoxifen (NOLVADEX) 20 MG tablet TAKE 1 TABLET(20 MG) BY MOUTH DAILY 90 tablet 3   DULoxetine (CYMBALTA) 30 MG capsule Take 30 mg by mouth daily. Reported on 06/02/2015 (Patient not taking: Reported on 01/19/2021)  0   escitalopram (LEXAPRO) 10 MG tablet  (Patient not taking: Reported on 01/19/2021)     FLUoxetine (PROZAC) 20 MG capsule Take 20 mg by mouth daily. (Patient not taking: Reported on 01/19/2021)     levothyroxine (SYNTHROID, LEVOTHROID) 25 MCG tablet Take 25 mcg by mouth daily before breakfast.  (Patient not taking: Reported on 01/19/2021)     No current facility-administered  medications for this visit.    OBJECTIVE: Vitals:   01/19/21 1006  BP: (!) 146/63  Pulse: 75  Resp: 16  Temp: (!) 97.2 F (36.2 C)  SpO2: 99%     Body mass index is 21.77 kg/m.    ECOG FS:0 - Asymptomatic  General: Well-developed, well-nourished, no acute distress. Eyes: Pink conjunctiva, anicteric sclera. HEENT: Normocephalic, moist mucous membranes. Lungs: No audible wheezing or coughing. Heart: Regular rate and rhythm. Abdomen: Soft, nontender, no obvious distention. Musculoskeletal: No edema, cyanosis, or clubbing. Neuro: Alert, answering all questions appropriately. Cranial nerves grossly intact. Skin: No rashes or petechiae noted. Psych: Normal affect.   LAB RESULTS:  Lab Results  Component Value Date   NA 141 08/02/2020   K 3.9 08/02/2020   CL 103 08/02/2020   CO2 27 08/02/2020   GLUCOSE 117 (H) 08/02/2020   BUN 19 08/02/2020   CREATININE 0.87 08/02/2020   CALCIUM 9.2 08/02/2020   PROT 6.7 08/02/2020   ALBUMIN 4.0  08/02/2020   AST 24 08/02/2020   ALT 18 08/02/2020   ALKPHOS 47 08/02/2020   BILITOT 0.6 08/02/2020   GFRNONAA >60 08/02/2020   GFRAA >60 10/05/2018    Lab Results  Component Value Date   WBC 9.5 08/02/2020   NEUTROABS 2.9 05/19/2015   HGB 13.8 08/02/2020   HCT 39.7 08/02/2020   MCV 93.9 08/02/2020   PLT 226 08/02/2020     STUDIES: US BREAST LTD UNI LEFT INC AXILLA  Result Date: 12/30/2020 CLINICAL DATA:  Patient status post right breast lumpectomy. EXAM: DIGITAL DIAGNOSTIC BILATERAL MAMMOGRAM WITH TOMOSYNTHESIS AND CAD; ULTRASOUND LEFT BREAST LIMITED TECHNIQUE: Bilateral digital diagnostic mammography and breast tomosynthesis was performed. The images were evaluated with computer-aided detection.; Targeted ultrasound examination of the left breast was performed. COMPARISON:  Previous exam(s). ACR Breast Density Category c: The breast tissue is heterogeneously dense, which may obscure small masses. FINDINGS: Stable post lumpectomy  changes right breast. No new suspicious findings within the right breast. Within the inferior left breast posteriorly on the MLO view there is a persistent focal asymmetry with mild associated distortion. On physical exam, no discrete mass palpated lower outer left breast. Targeted ultrasound is performed, showing normal tissue without suspicious mass within the lower outer left breast. No left axillary adenopathy. IMPRESSION: Indeterminate focal asymmetry with associated distortion within the lower outer left breast posterior depth. Postlumpectomy changes right breast. RECOMMENDATION: Stereotactic guided core needle biopsy left breast focal asymmetry with associated distortion. I have discussed the findings and recommendations with the patient. If applicable, a reminder letter will be sent to the patient regarding the next appointment. BI-RADS CATEGORY  4: Suspicious. Electronically Signed   By: Lovey Newcomer M.D.   On: 12/30/2020 11:04  MM DIAG BREAST TOMO BILATERAL  Result Date: 12/30/2020 CLINICAL DATA:  Patient status post right breast lumpectomy. EXAM: DIGITAL DIAGNOSTIC BILATERAL MAMMOGRAM WITH TOMOSYNTHESIS AND CAD; ULTRASOUND LEFT BREAST LIMITED TECHNIQUE: Bilateral digital diagnostic mammography and breast tomosynthesis was performed. The images were evaluated with computer-aided detection.; Targeted ultrasound examination of the left breast was performed. COMPARISON:  Previous exam(s). ACR Breast Density Category c: The breast tissue is heterogeneously dense, which may obscure small masses. FINDINGS: Stable post lumpectomy changes right breast. No new suspicious findings within the right breast. Within the inferior left breast posteriorly on the MLO view there is a persistent focal asymmetry with mild associated distortion. On physical exam, no discrete mass palpated lower outer left breast. Targeted ultrasound is performed, showing normal tissue without suspicious mass within the lower outer left  breast. No left axillary adenopathy. IMPRESSION: Indeterminate focal asymmetry with associated distortion within the lower outer left breast posterior depth. Postlumpectomy changes right breast. RECOMMENDATION: Stereotactic guided core needle biopsy left breast focal asymmetry with associated distortion. I have discussed the findings and recommendations with the patient. If applicable, a reminder letter will be sent to the patient regarding the next appointment. BI-RADS CATEGORY  4: Suspicious. Electronically Signed   By: Lovey Newcomer M.D.   On: 12/30/2020 11:04  MM CLIP PLACEMENT LEFT  Result Date: 01/06/2021 CLINICAL DATA:  Status post stereotactic guided biopsy EXAM: 3D DIAGNOSTIC LEFT MAMMOGRAM POST STEREOTACTIC BIOPSY COMPARISON:  Previous exam(s). FINDINGS: 3D Mammographic images were obtained following stereotactic guided biopsy of a LEFT breast asymmetry with subtle possible architectural distortion. The COIL shaped biopsy marking clip is in expected position at the site of biopsy on MLO view. A CC correlate was not definitively identified prospectively. IMPRESSION: Appropriate positioning of the COIL shaped  biopsy marking clip at the site of biopsy in the LEFT lower outer breast. Final Assessment: Post Procedure Mammograms for Marker Placement Electronically Signed   By: Valentino Saxon M.D.   On: 01/06/2021 13:35  MM LT BREAST BX W LOC DEV 1ST LESION IMAGE BX SPEC STEREO GUIDE  Addendum Date: 01/07/2021   ADDENDUM REPORT: 01/07/2021 11:50 ADDENDUM: PATHOLOGY revealed: A. LEFT BREAST, LOWER OUTER QUADRANT; STEREOTACTIC BIOPSY: - INVASIVE MAMMARY CARCINOMA, NO SPECIAL TYPE. Size of invasive carcinoma: 2 mm in this sample. Grade 1. Ductal carcinoma in situ: Not identified. Lymphovascular invasion: Not identified. Comment: The definitive grade will be assigned on the excisional specimen. Pathology results are CONCORDANT with imaging findings, per Dr. Valentino Saxon. Pathology results and  recommendations were discussed with patient via telephone on 01/07/2021. Patient reported biopsy site doing well with no adverse symptoms, and only slight tenderness at the site. Post biopsy care instructions were reviewed, questions were answered and my direct phone number was provided. Patient was instructed to call [facility] for any additional questions or concerns related to biopsy site. RECOMMENDATIONS: 1. Surgical consultation. Request for surgical consultation relayed to Al Pimple RN at [facility] by Electa Sniff RN on 01/07/2021. 2. Recommend bilateral breast MRI with and without contrast given patient's bilateral malignancy (RIGHT breast - DCIS in 2020). In addition, this will help confirm relation of biopsy clip to any residual cancer in the left breast given lack of a prospective CC correlate. Pathology results reported by Electa Sniff RN on 01/07/2021. Electronically Signed   By: Valentino Saxon M.D.   On: 01/07/2021 11:50   Result Date: 01/07/2021 CLINICAL DATA:  History of RIGHT-sided breast cancer. LEFT-sided asymmetry with subtle architectural distortion. Seen only on MLO view EXAM: LEFT BREAST STEREOTACTIC CORE NEEDLE BIOPSY COMPARISON:  Previous exams. FINDINGS: The patient and I discussed the procedure of stereotactic-guided biopsy including benefits and alternatives. We discussed the high likelihood of a successful procedure. We discussed the risks of the procedure including infection, bleeding, tissue injury, clip migration, and inadequate sampling. Informed written consent was given. The usual time out protocol was performed immediately prior to the procedure. Using sterile technique and 1% lidocaine and 1% lidocaine with epinephrine as local anesthetic, under stereotactic guidance, a 9 gauge vacuum assisted device was used to perform core needle biopsy of an asymmetry with subtle possible architectural distortion using a lateral approach. Specimen  radiograph was performed showing representative tissue. Lesion quadrant: Lower outer quadrant At the conclusion of the procedure, a COIL shaped tissue marker clip was deployed into the biopsy cavity. Follow-up 2-view mammogram was performed and dictated separately. IMPRESSION: Stereotactic-guided biopsy of LEFT breast asymmetry with subtle architectural distortion. No apparent complications. Electronically Signed: By: Valentino Saxon M.D. 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  She had consultation with surgery earlier today and plans to undergo lumpectomy on January 27, 2021.  It is unlikely she will require adjuvant chemotherapy, but will send Oncotype DX score for completeness.  Plan to discontinue tamoxifen and place patient on letrozole at the conclusion of her XRT.  Return to clinic on February 10, 2021 for further evaluation.  Patient will also have consultation with radiation oncology  on that day for adjuvant XRT. DCIS, right breast: Patient underwent lumpectomy on February 13, 2019.  Although patient had close margins of 1 mm, it was determined that reexcision is not necessary.  She proceeded directly with adjuvant XRT completing in March 2021.  Discontinue tamoxifen as above.  I spent a total of 30 minutes reviewing chart data, face-to-face evaluation with the patient, counseling and coordination of care as detailed above.   Patient expressed understanding and was in agreement with this plan. She also understands that She can call clinic at any time with any questions, concerns, or complaints.    Cancer Staging  Ductal carcinoma in situ (DCIS) of right breast Staging form: Breast, AJCC 8th  Edition - Clinical stage from 01/16/2019: Stage 0 (cTis (DCIS), cN0, cM0, ER+, PR+, HER2-) - Signed by Lloyd Huger, MD on 01/16/2019 Stage prefix: Initial diagnosis  Invasive ductal carcinoma of left breast [facility]) Staging form: Breast, AJCC 8th Edition - Clinical stage from 01/19/2021: Stage IA (cT1c, cN0, cM0, G1, ER+, PR+, HER2-) - Signed by Lloyd Huger, MD on 01/19/2021 Stage prefix: Initial diagnosis Histologic grading system: 3 grade system   Lloyd Huger, MD   01/19/2021 12:56 PM

## 2021-01-19 ENCOUNTER — Ambulatory Visit: Payer: Self-pay | Admitting: Surgery

## 2021-01-19 ENCOUNTER — Other Ambulatory Visit: Payer: Self-pay | Admitting: Surgery

## 2021-01-19 ENCOUNTER — Inpatient Hospital Stay: Payer: Medicare HMO | Attending: Oncology | Admitting: Oncology

## 2021-01-19 ENCOUNTER — Other Ambulatory Visit: Payer: Self-pay | Admitting: Emergency Medicine

## 2021-01-19 ENCOUNTER — Other Ambulatory Visit: Payer: Self-pay

## 2021-01-19 VITALS — BP 146/63 | HR 75 | Temp 97.2°F | Resp 16 | Wt 115.2 lb

## 2021-01-19 DIAGNOSIS — I10 Essential (primary) hypertension: Secondary | ICD-10-CM | POA: Diagnosis not present

## 2021-01-19 DIAGNOSIS — Z170 Estrogen receptor positive status [ER+]: Secondary | ICD-10-CM | POA: Insufficient documentation

## 2021-01-19 DIAGNOSIS — E039 Hypothyroidism, unspecified: Secondary | ICD-10-CM | POA: Diagnosis not present

## 2021-01-19 DIAGNOSIS — D0511 Intraductal carcinoma in situ of right breast: Secondary | ICD-10-CM

## 2021-01-19 DIAGNOSIS — Z8616 Personal history of COVID-19: Secondary | ICD-10-CM | POA: Insufficient documentation

## 2021-01-19 DIAGNOSIS — Z79899 Other long term (current) drug therapy: Secondary | ICD-10-CM | POA: Insufficient documentation

## 2021-01-19 DIAGNOSIS — Z79810 Long term (current) use of selective estrogen receptor modulators (SERMs): Secondary | ICD-10-CM | POA: Diagnosis not present

## 2021-01-19 DIAGNOSIS — C50512 Malignant neoplasm of lower-outer quadrant of left female breast: Secondary | ICD-10-CM

## 2021-01-19 DIAGNOSIS — C50912 Malignant neoplasm of unspecified site of left female breast: Secondary | ICD-10-CM

## 2021-01-19 NOTE — Progress Notes (Signed)
Pt has no questions or concerns at this time

## 2021-01-25 ENCOUNTER — Other Ambulatory Visit: Payer: Self-pay | Admitting: Surgery

## 2021-01-25 ENCOUNTER — Encounter
Admission: RE | Admit: 2021-01-25 | Discharge: 2021-01-25 | Disposition: A | Discharge status: Home / Self Care | Payer: Medicare HMO | Source: Ambulatory Visit | Attending: Surgery | Admitting: Surgery

## 2021-01-25 ENCOUNTER — Other Ambulatory Visit: Payer: Self-pay

## 2021-01-25 DIAGNOSIS — Z170 Estrogen receptor positive status [ER+]: Secondary | ICD-10-CM

## 2021-01-25 NOTE — Patient Instructions (Signed)
Your procedure is scheduled on: 01/27/21 Report to [facility] AND GO DIRECTLY TO THE RADIOLOGY DESK. YOU DO NOT NEED TO STOP AT ADMITTING AND REGISTRATION. [employer] IS AVAILABLE FREE OF CHARGE FOR YOUR CONVENIENCE.  CHECK IN AT 12:45 PM UNLESS YOU HAVE BEEN INSTRUCTED OTHERWISE.   Remember: Instructions that are not followed completely may result in serious medical risk, up to and including death, or upon the discretion of your surgeon and anesthesiologist your surgery may need to be rescheduled.     _X__ 1. Do not eat food OR DRINK ANY LIQUIDS after midnight the night before your procedure.                 No gum chewing or hard candies.   __X__2.  On the morning of surgery brush your teeth with toothpaste and water, you                 may rinse your mouth with mouthwash if you wish.  Do not swallow any              toothpaste of mouthwash.     _X__ 3.  No Alcohol for 24 hours before or after surgery.   _X__ 4.  Do Not Smoke or use e-cigarettes For 24 Hours Prior to Your Surgery.                 Do not use any chewable tobacco products for at least 6 hours prior to                 surgery.  ____  5.  Bring all medications with you on the day of surgery if instructed.   __X__  6.  Notify your doctor if there is any change in your medical condition      (cold, fever, infections).     Do not wear jewelry, make-up, hairpins, clips or nail polish. Do not wear lotions, powders, or perfumes or deodorant Do not shave body hair 48 hours prior to surgery. Men may shave face and neck. Do not bring valuables to the hospital.    [facility] is not responsible for any belongings or valuables.  Contacts, dentures/partials or body piercings may not be worn into surgery. Bring a case for your contacts, glasses or hearing aids, a denture cup will be supplied. Leave your suitcase in the car. After surgery it may be brought to your room. For patients admitted to the  hospital, discharge time is determined by your treatment team.   Patients discharged the day of surgery will not be allowed to drive home.   Please read over the following fact sheets that you were given:   CHG soap  __X__ Take these medicines the morning of surgery with A SIP OF WATER:    1. amLODipine (NORVASC) 5 MG tablet  2. omeprazole (PRILOSEC) 20 MG capsule  3. solifenacin (VESICARE) 5 MG tablet  4.  5.  6.  ____ Fleet Enema (as directed)   __X__ Use CHG Soap/SAGE wipes as directed  ____ Use inhalers on the day of surgery  ____ Stop metformin/Janumet/Farxiga 2 days prior to surgery    ____ Take 1/2 of usual insulin dose the night before surgery. No insulin the morning          of surgery.   ____ Stop Blood Thinners Coumadin/Plavix/Xarelto/Pleta/Pradaxa/Eliquis/Effient/Aspirin  on   Or contact your Surgeon, Cardiologist or Medical Doctor regarding  ability to stop your blood thinners  __X__ Stop Anti-inflammatories 7 days before surgery such as Advil, Ibuprofen, Motrin,  BC or Goodies Powder, Naprosyn, Naproxen, Aleve, Aspirin    __X__ Stop all herbal supplements, fish oil or vitamin E until after surgery.    ____ Bring C-Pap to the hospital.

## 2021-01-26 ENCOUNTER — Encounter: Payer: Self-pay | Admitting: Urgent Care

## 2021-01-26 ENCOUNTER — Other Ambulatory Visit: Admission: RE | Admit: 2021-01-26 | Payer: Medicare HMO | Source: Ambulatory Visit

## 2021-01-26 ENCOUNTER — Ambulatory Visit: Admission: RE | Admit: 2021-01-26 | Payer: Medicare HMO | Source: Ambulatory Visit

## 2021-01-26 NOTE — Progress Notes (Signed)
Navigation initiated.  Patient scheduled with Dr. Grayland Ormond, and Dr. Lysle Pearl.

## 2021-01-27 ENCOUNTER — Ambulatory Visit
Admission: RE | Admit: 2021-01-27 | Discharge: 2021-01-27 | Disposition: A | Discharge status: Home / Self Care | Payer: Medicare HMO | Source: Ambulatory Visit | Attending: Surgery | Admitting: Surgery

## 2021-01-27 ENCOUNTER — Ambulatory Visit: Payer: Medicare HMO

## 2021-01-27 ENCOUNTER — Telehealth: Payer: Self-pay | Admitting: *Deleted

## 2021-01-27 ENCOUNTER — Other Ambulatory Visit: Payer: Self-pay

## 2021-01-27 DIAGNOSIS — C50512 Malignant neoplasm of lower-outer quadrant of left female breast: Secondary | ICD-10-CM | POA: Diagnosis not present

## 2021-01-27 DIAGNOSIS — Z170 Estrogen receptor positive status [ER+]: Secondary | ICD-10-CM | POA: Insufficient documentation

## 2021-01-27 DIAGNOSIS — C50912 Malignant neoplasm of unspecified site of left female breast: Secondary | ICD-10-CM | POA: Diagnosis not present

## 2021-01-27 DIAGNOSIS — C50911 Malignant neoplasm of unspecified site of right female breast: Secondary | ICD-10-CM | POA: Diagnosis not present

## 2021-01-27 MED ORDER — GADOBUTROL 1 MMOL/ML IV SOLN
5.0000 mL | Freq: Once | INTRAVENOUS | Status: AC | PRN
  Administered 2021-01-27: 5 mL via INTRAVENOUS

## 2021-01-27 NOTE — Telephone Encounter (Signed)
Dr. Grayland Ormond request oncotype for this patient. Pt has a mastectomy today. Will need to wait until path results are available from mastectomy before submitting this oncotype. Dr. Virgel Manifold' RN- Tiffany made aware.

## 2021-01-31 ENCOUNTER — Other Ambulatory Visit: Payer: Self-pay

## 2021-01-31 ENCOUNTER — Encounter
Admission: RE | Admit: 2021-01-31 | Discharge: 2021-01-31 | Disposition: A | Discharge status: Home / Self Care | Payer: Medicare HMO | Source: Ambulatory Visit | Attending: Surgery | Admitting: Surgery

## 2021-01-31 ENCOUNTER — Ambulatory Visit
Admission: RE | Admit: 2021-01-31 | Discharge: 2021-01-31 | Disposition: A | Discharge status: Home / Self Care | Payer: Medicare HMO | Source: Ambulatory Visit | Attending: Surgery | Admitting: Surgery

## 2021-01-31 DIAGNOSIS — Z170 Estrogen receptor positive status [ER+]: Secondary | ICD-10-CM | POA: Diagnosis not present

## 2021-01-31 DIAGNOSIS — C50512 Malignant neoplasm of lower-outer quadrant of left female breast: Secondary | ICD-10-CM | POA: Diagnosis not present

## 2021-01-31 DIAGNOSIS — Z01810 Encounter for preprocedural cardiovascular examination: Secondary | ICD-10-CM | POA: Insufficient documentation

## 2021-01-31 DIAGNOSIS — C50912 Malignant neoplasm of unspecified site of left female breast: Secondary | ICD-10-CM | POA: Diagnosis not present

## 2021-02-02 ENCOUNTER — Other Ambulatory Visit: Payer: Self-pay

## 2021-02-02 ENCOUNTER — Encounter: Payer: Self-pay | Admitting: Surgery

## 2021-02-02 ENCOUNTER — Ambulatory Visit: Payer: Medicare HMO | Admitting: Urgent Care

## 2021-02-02 ENCOUNTER — Ambulatory Visit
Admission: RE | Admit: 2021-02-02 | Discharge: 2021-02-02 | Disposition: A | Discharge status: Home / Self Care | Payer: Medicare HMO | Source: Ambulatory Visit | Attending: Surgery | Admitting: Surgery

## 2021-02-02 ENCOUNTER — Encounter
Admission: RE | Disposition: A | Discharge status: Home / Self Care | Payer: Self-pay | Source: Ambulatory Visit | Attending: Surgery

## 2021-02-02 ENCOUNTER — Ambulatory Visit: Payer: Medicare HMO | Admitting: Anesthesiology

## 2021-02-02 DIAGNOSIS — Z01810 Encounter for preprocedural cardiovascular examination: Secondary | ICD-10-CM | POA: Diagnosis not present

## 2021-02-02 DIAGNOSIS — I10 Essential (primary) hypertension: Secondary | ICD-10-CM | POA: Diagnosis not present

## 2021-02-02 DIAGNOSIS — Z86010 Personal history of colonic polyps: Secondary | ICD-10-CM | POA: Insufficient documentation

## 2021-02-02 DIAGNOSIS — C50512 Malignant neoplasm of lower-outer quadrant of left female breast: Secondary | ICD-10-CM | POA: Diagnosis not present

## 2021-02-02 DIAGNOSIS — Z170 Estrogen receptor positive status [ER+]: Secondary | ICD-10-CM | POA: Diagnosis not present

## 2021-02-02 DIAGNOSIS — Z853 Personal history of malignant neoplasm of breast: Secondary | ICD-10-CM | POA: Insufficient documentation

## 2021-02-02 DIAGNOSIS — E785 Hyperlipidemia, unspecified: Secondary | ICD-10-CM | POA: Diagnosis not present

## 2021-02-02 DIAGNOSIS — C50912 Malignant neoplasm of unspecified site of left female breast: Secondary | ICD-10-CM

## 2021-02-02 DIAGNOSIS — K219 Gastro-esophageal reflux disease without esophagitis: Secondary | ICD-10-CM | POA: Diagnosis not present

## 2021-02-02 DIAGNOSIS — I081 Rheumatic disorders of both mitral and tricuspid valves: Secondary | ICD-10-CM | POA: Diagnosis not present

## 2021-02-02 DIAGNOSIS — R928 Other abnormal and inconclusive findings on diagnostic imaging of breast: Secondary | ICD-10-CM | POA: Diagnosis not present

## 2021-02-02 HISTORY — PX: PARTIAL MASTECTOMY WITH AXILLARY SENTINEL LYMPH NODE BIOPSY: SHX6004

## 2021-02-02 SURGERY — PARTIAL MASTECTOMY WITH AXILLARY SENTINEL LYMPH NODE BIOPSY
Anesthesia: General | Site: Breast | Laterality: Left

## 2021-02-02 MED ORDER — TECHNETIUM TC 99M TILMANOCEPT KIT
1.0000 | PACK | Freq: Once | INTRAVENOUS | Status: AC | PRN
  Administered 2021-02-02: 1.1 via INTRADERMAL

## 2021-02-02 MED ORDER — DEXAMETHASONE SODIUM PHOSPHATE 10 MG/ML IJ SOLN
INTRAMUSCULAR | Status: DC | PRN
  Administered 2021-02-02: 5 mg via INTRAVENOUS

## 2021-02-02 MED ORDER — SEVOFLURANE IN SOLN
RESPIRATORY_TRACT | Status: AC
  Filled 2021-02-02: qty 250

## 2021-02-02 MED ORDER — ORAL CARE MOUTH RINSE: 15.0000 mL | Freq: Once | OROMUCOSAL | Status: AC

## 2021-02-02 MED ORDER — IBUPROFEN 400 MG PO TABS: 400.0000 mg | ORAL_TABLET | Freq: Three times a day (TID) | ORAL | 0 refills | Status: AC | PRN

## 2021-02-02 MED ORDER — CHLORHEXIDINE GLUCONATE 0.12 % MT SOLN
15.0000 mL | Freq: Once | OROMUCOSAL | Status: AC
  Administered 2021-02-02: 15 mL via OROMUCOSAL

## 2021-02-02 MED ORDER — LACTATED RINGERS IV SOLN: INTRAVENOUS | Status: DC

## 2021-02-02 MED ORDER — DOCUSATE SODIUM 100 MG PO CAPS: 100.0000 mg | ORAL_CAPSULE | Freq: Two times a day (BID) | ORAL | 0 refills | Status: AC | PRN

## 2021-02-02 MED ORDER — LIDOCAINE HCL (PF) 1 % IJ SOLN
INTRAMUSCULAR | Status: DC | PRN
  Administered 2021-02-02: 16 mL

## 2021-02-02 MED ORDER — ONDANSETRON HCL 4 MG/2ML IJ SOLN: 4.0000 mg | Freq: Once | INTRAMUSCULAR | Status: DC | PRN

## 2021-02-02 MED ORDER — CLINDAMYCIN PHOSPHATE 900 MG/50ML IV SOLN
900.0000 mg | INTRAVENOUS | Status: AC
  Administered 2021-02-02: 900 mg via INTRAVENOUS

## 2021-02-02 MED ORDER — ACETAMINOPHEN 325 MG PO TABS: 650.0000 mg | ORAL_TABLET | Freq: Three times a day (TID) | ORAL | 0 refills | Status: AC | PRN

## 2021-02-02 MED ORDER — MIDAZOLAM HCL 2 MG/2ML IJ SOLN
INTRAMUSCULAR | Status: AC
  Filled 2021-02-02: qty 2

## 2021-02-02 MED ORDER — CLINDAMYCIN PHOSPHATE 900 MG/50ML IV SOLN
INTRAVENOUS | Status: AC
  Filled 2021-02-02: qty 50

## 2021-02-02 MED ORDER — ACETAMINOPHEN 10 MG/ML IV SOLN: 1000.0000 mg | Freq: Once | INTRAVENOUS | Status: DC | PRN

## 2021-02-02 MED ORDER — ACETAMINOPHEN 10 MG/ML IV SOLN
INTRAVENOUS | Status: AC
  Filled 2021-02-02: qty 100

## 2021-02-02 MED ORDER — TECHNETIUM TC 99M TILMANOCEPT KIT: 1.0000 | PACK | Freq: Once | INTRAVENOUS | Status: DC

## 2021-02-02 MED ORDER — LIDOCAINE HCL (PF) 1 % IJ SOLN
INTRAMUSCULAR | Status: AC
  Filled 2021-02-02: qty 30

## 2021-02-02 MED ORDER — CHLORHEXIDINE GLUCONATE 0.12 % MT SOLN
OROMUCOSAL | Status: AC
  Filled 2021-02-02: qty 15

## 2021-02-02 MED ORDER — HYDROCODONE-ACETAMINOPHEN 5-325 MG PO TABS
1.0000 | ORAL_TABLET | Freq: Four times a day (QID) | ORAL | 0 refills | Status: DC | PRN
Start: 2021-02-02 — End: 2021-06-21

## 2021-02-02 MED ORDER — OXYCODONE HCL 5 MG/5ML PO SOLN: 5.0000 mg | Freq: Once | ORAL | Status: AC | PRN

## 2021-02-02 MED ORDER — EPHEDRINE SULFATE 50 MG/ML IJ SOLN
INTRAMUSCULAR | Status: DC | PRN
Start: 2021-02-02 — End: 2021-02-02
  Administered 2021-02-02: 5 mg via INTRAVENOUS

## 2021-02-02 MED ORDER — LIDOCAINE HCL (CARDIAC) PF 100 MG/5ML IV SOSY
PREFILLED_SYRINGE | INTRAVENOUS | Status: DC | PRN
  Administered 2021-02-02: 50 mg via INTRAVENOUS

## 2021-02-02 MED ORDER — BUPIVACAINE-EPINEPHRINE (PF) 0.5% -1:200000 IJ SOLN
INTRAMUSCULAR | Status: AC
  Filled 2021-02-02: qty 30

## 2021-02-02 MED ORDER — STERILE WATER FOR IRRIGATION IR SOLN
Status: DC | PRN
  Administered 2021-02-02: 1000 mL

## 2021-02-02 MED ORDER — CHLORHEXIDINE GLUCONATE CLOTH 2 % EX PADS
6.0000 | MEDICATED_PAD | Freq: Once | CUTANEOUS | Status: AC
  Administered 2021-02-02: 6 via TOPICAL

## 2021-02-02 MED ORDER — FENTANYL CITRATE (PF) 100 MCG/2ML IJ SOLN: 25.0000 ug | INTRAMUSCULAR | Status: DC | PRN

## 2021-02-02 MED ORDER — PROPOFOL 10 MG/ML IV BOLUS
INTRAVENOUS | Status: DC | PRN
  Administered 2021-02-02 (×2): 30 mg via INTRAVENOUS
  Administered 2021-02-02: 100 mg via INTRAVENOUS

## 2021-02-02 MED ORDER — OXYCODONE HCL 5 MG PO TABS
5.0000 mg | ORAL_TABLET | Freq: Once | ORAL | Status: AC | PRN
  Administered 2021-02-02: 5 mg via ORAL

## 2021-02-02 MED ORDER — ONDANSETRON HCL 4 MG/2ML IJ SOLN
INTRAMUSCULAR | Status: AC
  Filled 2021-02-02: qty 2

## 2021-02-02 MED ORDER — OXYCODONE HCL 5 MG PO TABS
ORAL_TABLET | ORAL | Status: AC
  Filled 2021-02-02: qty 1

## 2021-02-02 MED ORDER — FENTANYL CITRATE (PF) 100 MCG/2ML IJ SOLN
INTRAMUSCULAR | Status: AC
  Filled 2021-02-02: qty 2

## 2021-02-02 MED ORDER — FENTANYL CITRATE (PF) 100 MCG/2ML IJ SOLN
INTRAMUSCULAR | Status: DC | PRN
  Administered 2021-02-02: 25 ug via INTRAVENOUS

## 2021-02-02 MED ORDER — EPHEDRINE 5 MG/ML INJ
INTRAVENOUS | Status: AC
  Filled 2021-02-02: qty 5

## 2021-02-02 MED ORDER — GLYCOPYRROLATE 0.2 MG/ML IJ SOLN
INTRAMUSCULAR | Status: AC
  Filled 2021-02-02: qty 1

## 2021-02-02 MED ORDER — ACETAMINOPHEN 10 MG/ML IV SOLN
INTRAVENOUS | Status: DC | PRN
Start: 2021-02-02 — End: 2021-02-02
  Administered 2021-02-02: 1000 mg via INTRAVENOUS

## 2021-02-02 MED ORDER — LIDOCAINE HCL (PF) 2 % IJ SOLN
INTRAMUSCULAR | Status: AC
  Filled 2021-02-02: qty 5

## 2021-02-02 MED ORDER — ONDANSETRON HCL 4 MG/2ML IJ SOLN
INTRAMUSCULAR | Status: DC | PRN
  Administered 2021-02-02: 4 mg via INTRAVENOUS

## 2021-02-02 MED ORDER — DEXAMETHASONE SODIUM PHOSPHATE 10 MG/ML IJ SOLN
INTRAMUSCULAR | Status: AC
  Filled 2021-02-02: qty 1

## 2021-02-02 MED ORDER — GLYCOPYRROLATE 0.2 MG/ML IJ SOLN
INTRAMUSCULAR | Status: DC | PRN
  Administered 2021-02-02: .2 mg via INTRAVENOUS

## 2021-02-02 SURGICAL SUPPLY — 46 items
APPLIER CLIP 11 MED OPEN (CLIP)
BLADE SURG 15 STRL LF DISP TIS (BLADE) ×1 IMPLANT
BLADE SURG 15 STRL SS (BLADE) ×1
CHLORAPREP W/TINT 26 (MISCELLANEOUS) ×2 IMPLANT
CLIP APPLIE 11 MED OPEN (CLIP) IMPLANT
CNTNR SPEC 2.5X3XGRAD LEK (MISCELLANEOUS) ×1
CONT SPEC 4OZ STER OR WHT (MISCELLANEOUS) ×1
CONTAINER SPEC 2.5X3XGRAD LEK (MISCELLANEOUS) ×1 IMPLANT
DERMABOND ADVANCED (GAUZE/BANDAGES/DRESSINGS) ×1
DERMABOND ADVANCED .7 DNX12 (GAUZE/BANDAGES/DRESSINGS) ×1 IMPLANT
DEVICE DSSCT PLSMBLD 3.0S LGHT (MISCELLANEOUS) ×1 IMPLANT
DEVICE DUBIN SPECIMEN MAMMOGRA (MISCELLANEOUS) ×2 IMPLANT
DRAPE LAPAROTOMY TRNSV 106X77 (MISCELLANEOUS) ×2 IMPLANT
ELECT CAUTERY BLADE TIP 2.5 (TIP) ×2
ELECT REM PT RETURN 9FT ADLT (ELECTROSURGICAL) ×2
ELECTRODE CAUTERY BLDE TIP 2.5 (TIP) ×1 IMPLANT
ELECTRODE REM PT RTRN 9FT ADLT (ELECTROSURGICAL) ×1 IMPLANT
GAUZE 4X4 16PLY ~~LOC~~+RFID DBL (SPONGE) ×2 IMPLANT
GLOVE SURG SYN 6.5 ES PF (GLOVE) ×2 IMPLANT
GLOVE SURG UNDER POLY LF SZ7 (GLOVE) ×2 IMPLANT
GOWN STRL REUS W/ TWL LRG LVL3 (GOWN DISPOSABLE) ×3 IMPLANT
GOWN STRL REUS W/TWL LRG LVL3 (GOWN DISPOSABLE) ×3
JACKSON PRATT 10 (INSTRUMENTS) IMPLANT
KIT MARKER MARGIN INK (KITS) ×2 IMPLANT
KIT TURNOVER KIT A (KITS) ×2 IMPLANT
LABEL OR SOLS (LABEL) ×2 IMPLANT
LIGHT WAVEGUIDE WIDE FLAT (MISCELLANEOUS) IMPLANT
MANIFOLD NEPTUNE II (INSTRUMENTS) ×2 IMPLANT
MARKER MARGIN CORRECT CLIP (MARKER) ×2 IMPLANT
NEEDLE HYPO 22GX1.5 SAFETY (NEEDLE) ×4 IMPLANT
PACK BASIN MINOR ARMC (MISCELLANEOUS) ×2 IMPLANT
PLASMABLADE 3.0S W/LIGHT (MISCELLANEOUS) ×2
SET LOCALIZER 20 PROBE US (MISCELLANEOUS) ×2 IMPLANT
SLEVE PROBE SENORX GAMMA FIND (MISCELLANEOUS) ×2 IMPLANT
SUT MNCRL 4-0 (SUTURE) ×2
SUT MNCRL 4-0 27XMFL (SUTURE) ×2
SUT SILK 2 0 (SUTURE)
SUT SILK 2-0 30XBRD TIE 12 (SUTURE) IMPLANT
SUT SILK 3 0 12 30 (SUTURE) IMPLANT
SUT VIC AB 3-0 SH 27 (SUTURE) ×2
SUT VIC AB 3-0 SH 27X BRD (SUTURE) ×2 IMPLANT
SUTURE MNCRL 4-0 27XMF (SUTURE) ×2 IMPLANT
SYR 20ML LL LF (SYRINGE) ×2 IMPLANT
TUBING CONNECTING 10 (TUBING) ×2 IMPLANT
WATER STERILE IRR 1000ML POUR (IV SOLUTION) ×2 IMPLANT
WATER STERILE IRR 500ML POUR (IV SOLUTION) ×2 IMPLANT

## 2021-02-02 NOTE — Anesthesia Procedure Notes (Signed)
Procedure Name: LMA Insertion Date/Time: 02/02/2021 1:59 PM Performed by: Aline Brochure, CRNA Pre-anesthesia Checklist: Patient identified, Patient being monitored, Timeout performed, Emergency Drugs available and Suction available Patient Re-evaluated:Patient Re-evaluated prior to induction Oxygen Delivery Method: Circle system utilized Preoxygenation: Pre-oxygenation with 100% oxygen Induction Type: IV induction Ventilation: Mask ventilation without difficulty LMA: LMA inserted LMA Size: 3.5 Tube type: Oral Number of attempts: 1 Placement Confirmation: positive ETCO2 and breath sounds checked- equal and bilateral Tube secured with: Tape Dental Injury: Teeth and Oropharynx as per pre-operative assessment

## 2021-02-02 NOTE — Transfer of Care (Signed)
Immediate Anesthesia Transfer of Care Note  Patient: Chelsea Peters  Procedure(s) Performed: Procedure(s): PARTIAL MASTECTOMY WITH AXILLARY SENTINEL LYMPH NODE BIOPSY & RF guided (Left)  Patient Location: PACU  Anesthesia Type:General  Level of Consciousness: sedated  Airway & Oxygen Therapy: Patient Spontanous Breathing and Patient connected to face mask oxygen  Post-op Assessment: Report given to RN and Post -op Vital signs reviewed and stable  Post vital signs: Reviewed and stable  Last Vitals:  Vitals:   02/02/21 1316 02/02/21 1511  BP: (!) 152/68 (!) 147/73  Pulse: 64 96  Resp: 16 13  Temp: 36.7 C (!) 36.2 C  SpO2: 488% 891%    Complications: No apparent anesthesia complications

## 2021-02-02 NOTE — H&P (Signed)
Subjective:   CC: Malignant neoplasm of lower-outer quadrant of left breast of female, estrogen receptor positive (CMS-HCC) [C50.512, Z17.0] HPI: Chelsea Peters is a 80 y.o. female who was referred by Denyce Robert, * for evaluation of above. Change was noted on last screening mammogram. 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  Past Surgical History: has a past surgical history that includes Cholecystectomy; laminectomy posterior lumbar 1/2 segments; Hysterectomy; hemithyroidectomy (Left, 05/2014); Colonoscopy (04/28/2004); Colonoscopy (07/01/2008); Colonoscopy (06/02/2013); Colonoscopy (01/27/2019); egd (01/27/2019); and partial mastectomy with sentinel lymph node biopsy (Right, 01/2019).  Family History: family history includes Breast cancer in her mother; High blood pressure (Hypertension) in her paternal aunt; Hyperlipidemia (Elevated cholesterol) in her paternal aunt; No Known Problems in her father; Stroke in her mother.  Social History: reports that she has never smoked. She has never used smokeless tobacco. She reports current alcohol use. She reports that she does not use drugs.  Current Medications: has a current medication list which includes the following prescription(s): alprazolam, amlodipine, cholecalciferol, fluoxetine, omeprazole, solifenacin, tamoxifen, and escitalopram oxalate.  Allergies:  Allergies as of 01/19/2021 - Reviewed 01/19/2021  Allergen Reaction Noted   Ambien [zolpidem] Hallucination 06/13/2013   Latex Itching 01/29/2019   Penicillins Other (See Comments)   ROS:  A 15 point review of systems was performed and was negative except as noted in  HPI  Objective:    BP 132/71  Pulse 66  Ht 154.9 cm (_0 )  Wt 49.4 kg (109 lb)  BMI 20.60 kg/m   Constitutional : No distress, cooperative, alert  Lymphatics/Throat: Supple with no lymphadenopathy  Respiratory: Clear to auscultation bilaterally  Cardiovascular: Regular rate and rhythm  Gastrointestinal: Soft, non-tender, non-distended, no organomegaly.  Musculoskeletal: Steady gait and movement  Skin: Cool and moist, right breast surgical scars, healed  Psychiatric: Normal affect, non-agitated, not confused  Breast: Chaperone present for exam. Left breast with no obvious lump, lymphadenopathy, no new concerns on right.    LABS:  Reason for Addendum #1:  Breast Biomarker Results   Specimen Submitted:  A. Breast, left lower outer   Clinical History: Coil clip at site of biopsy in left lower outer breast  asymmetry with subtle possible architectural distortion.   DIAGNOSIS:  A. LEFT BREAST, LOWER OUTER QUADRANT; STEREOTACTIC BIOPSY:  - INVASIVE MAMMARY CARCINOMA, NO SPECIAL TYPE.   Size of invasive carcinoma: 2 mm in this sample  Histologic grade of invasive carcinoma: Grade 1                       Glandular/tubular differentiation score: 2                       Nuclear pleomorphism score: 2                       Mitotic rate score: 1                       Total score: 5  Ductal carcinoma in situ: Not identified  Lymphovascular invasion: Not identified   Comment:  The definitive grade will be assigned on the excisional specimen.  ER/PR/HER2 immunohistochemistry will be performed on block A4, with  reflex to [facility] for HER2  2+. The results will be reported in an addendum.   GROSS DESCRIPTION:  A. Labeled: Left breast stereotactic biopsy lower outer posterior depth  asymmetry  Received: in a formalin-filled [place] collection device  Specimen radiograph image(s) available for review  Time/Date in fixative: Collected at 1:16 PM on 01/06/2021 and placed in  formalin  at 1:18 PM on 01/06/2021  Cold ischemic time: Less than 5 minutes  Total fixation time: Approximately 7 hours  Core pieces: Multiple  Measurement: Aggregate, 9 x 1.2 x 0.4 cm  Description / comments: Received are cores and fragments of yellow  fibrofatty tissue.  A distinct diagram is not received on the container  or with the requisition.  Inked: Blue  Entirely submitted in cassette(s):   1 - sections A and B  2 - sections C and D  3 - sections E and F  4 - sections G and H  5 - sections I and J  6 - sections K and L with remaining free-floating fragments   RB 01/06/2021   Final Diagnosis performed by Betsy Pries, MD.   Electronically signed  01/07/2021 9:58:12AM  The electronic signature indicates that the named Attending Pathologist  has evaluated the specimen  Technical component performed at [facility], [address] [place],  [place] [address] Lab: [phone] Dir: Rush Farmer, MD, MMM   Professional component performed at [facility], [facility], [place], [place], [place] [address] Lab: [phone]  Dir: Kathi Simpers, MD   ADDENDUM:  CASE SUMMARY: BREAST BIOMARKER TESTS  Estrogen Receptor (ER) Status: POSITIVE          Percentage of cells with nuclear positivity: 90-100%          Average intensity of staining: Strong   Progesterone Receptor (PgR) Status: POSITIVE          Percentage of cells with nuclear positivity: 70-80%          Average intensity of staining: Moderate   HER2 (by immunohistochemistry): NEGATIVE (Score 1+)  Ki-67: Not performed   Cold Ischemia and Fixation Times: Meet requirements specified in latest  version of the ASCO/CAP guidelines  Testing Performed on Block Number(s): A4   METHODS  Fixative: Formalin  Estrogen Receptor:  FDA cleared ([place]) Primary Antibody:  SP1  Progesterone Receptor: FDA cleared ([place]) Primary Antibody: 1E2  HER2 (by IHC): FDA approved ([place]) Primary Antibody: 4B5 ([facility])   Immunohistochemistry controls worked appropriately. Slides were prepared  by [facility], [place], [place], and interpreted by Betsy Pries, MD.  (v1.5.0.0)   Addendum #1 performed by Betsy Pries, MD.   Electronically signed  01/11/2021 2:45:55PM  The electronic signature indicates that the named Attending Pathologist  has evaluated the specimen  Technical component performed at [facility], [address] [place],  [place] [address] Lab: [phone] Dir: Rush Farmer, MD, MMM   Professional component performed at [facility], [facility], [place], [place], [place] [address] Lab: [phone]  Dir: Kathi Simpers, MD   RADS: CLINICAL DATA:  Patient status post right breast lumpectomy.   EXAM:  DIGITAL DIAGNOSTIC BILATERAL MAMMOGRAM WITH TOMOSYNTHESIS AND CAD;  ULTRASOUND LEFT BREAST LIMITED   TECHNIQUE:  Bilateral digital diagnostic mammography and breast tomosynthesis  was performed. The images were evaluated with computer-aided  detection.; Targeted ultrasound examination of the left breast was  performed.   COMPARISON:  Previous exam(s).   ACR Breast Density Category c: The breast tissue is heterogeneously  dense, which may obscure small masses.  FINDINGS:  Stable post lumpectomy changes right breast. No new suspicious  findings within the right breast.   Within the inferior left breast posteriorly on the MLO view there is  a persistent focal asymmetry with mild associated distortion.   On physical exam, no discrete mass palpated lower outer left breast.   Targeted ultrasound is performed, showing normal tissue without  suspicious mass within the lower outer left breast. No left axillary  adenopathy.   IMPRESSION:  Indeterminate focal asymmetry with associated distortion within the  lower outer left breast posterior depth.   Postlumpectomy changes right breast.   RECOMMENDATION:  Stereotactic guided core needle biopsy left  breast focal asymmetry  with associated distortion.   I have discussed the findings and recommendations with the patient.  If applicable, a reminder letter will be sent to the patient  regarding the next appointment.   BI-RADS CATEGORY  4: Suspicious.   Electronically Signed    By: Lovey Newcomer M.D.    On: 12/30/2020 11:04  Assessment:   Malignant neoplasm of lower-outer quadrant of left breast of female, estrogen receptor positive (CMS-HCC) [C50.512, Z17.0]  Plan:    1. Malignant neoplasm of lower-outer quadrant of left breast of female, estrogen receptor positive (CMS-HCC) [C50.512, Z17.0] Discussed the risk of surgery including recurrence, chronic pain, post-op infxn, poor/delayed wound healing, poor cosmesis, seroma, hematoma formation, and possible re-operation to address said risks. The risks of general anesthetic, if used, includes MI, CVA, sudden death or even reaction to anesthetic medications also discussed.  Typical post-op recovery time and possbility of activity restrictions were also discussed. Alternatives include continued observation. Benefits include possible symptom relief, pathologic evaluation, and/or curative excision.   The patient verbalized understanding and all questions were answered to the patient's satisfaction.  2. Patient has elected to proceed with surgical treatment. Procedure will be scheduled. LEFT partial mastectomy with RF tag, SLNB.

## 2021-02-02 NOTE — Op Note (Signed)
Preoperative diagnosis:  left breast carcinoma.  Postoperative diagnosis: same.   Procedure: RF tag-localized left  breast partial mastectomy.                      left  Axillary Sentinel Lymph node biopsy  Anesthesia: GETA  Surgeon: Dr. Benjamine Sprague  Wound Classification: Clean  Indications: Patient is a 80 y.o. female with a nonpalpable left  breast mass noted on mammography with core biopsy demonstrating carcinoma, requires RF localizer placement, partial mastectomy for treatment with sentinel lymph node biopsy.   Specimen: left Breast mass, Sentinel Lymph nodes x 3  Complications: None  Estimated Blood Loss: 8mL  Findings: 1. Specimen mammography shows marker and RF localizer on specimen 2. Pathology call refers gross examination of margins was negative 3. No other palpable mass or lymph node identified.   Operation performed with curative intent:Yes  Tracer(s) used to identify sentinel nodes in the upfront surgery (non-neoadjuvant) setting (select all that apply):Radioactive Tracer  Tracer(s) used to identify sentinel nodes in the neoadjuvant setting (select all that apply):N/A  All nodes (colored or non-colored) present at the end of a dye-filled lymphatic channel were removed:N/A  All significantly radioactive nodes were removed:Yes  All palpable suspicious nodes were removed:N/A  Biopsy-proven positive nodes marked with clips prior to chemotherapy were identified and removed:N/A    Description of procedure: RF localization was performed by radiology prior to procedure. In the nuclear medicine suite, the subareolar region was injected with Tc-99 sulfur colloid the morning of procedure. Localization studies were reviewed. The patient was taken to the operating room and placed supine on the operating table, and after general anesthesia the left breast and axilla were prepped and draped in the usual sterile fashion. A time-out was completed verifying correct patient,  procedure, site, positioning, and implant(s) and/or special equipment prior to beginning this procedure.  By identifying the RF localizer, the probable trajectory and location of the mass was visualized. A skin incision was planned in such a way as to minimize the amount of dissection to reach the area of concern noted on MRI.  The skin incision was made after infusion of local. Flaps were raised and  Sharp and blunt dissection was then taken down to the area, taking care to include the entire RF localizer and involved tissue noted on MRI, with margin of grossly normal tissue. The specimen was removed. The specimen was oriented with paint and sent to radiology with the localization studies. Confirmation was received that the entire target lesion had been resected. A hand-held gamma probe was used to identify the location of the hottest spot in the axilla. An incision was made around the caudal axillary hairline. Sharp and blunt Dissection was carried down to subdermal facias. The probe was placed within wound and again, the point of maximal count was found. Dissection continue until nodule was identified. The probe was placed in contact with the node and 1300 counts were recorded. The node was excised in its entirety. Ex vivo, the node measured 1300 counts when placed on the probe. The bed of the node measured 150 counts. Two additional hot spot were detected and the nodes were excised in similar fashion. No additional hot spots were identified. No clinically abnormal nodes were palpated. Both wounds irrigated, hemostasis was achieved and the wound closed in layers with  interrupted sutures of 3-0 Vicryl in deep dermal layer and a running subcuticular suture of Monocryl 4-0, then dressed with dermabond. The patient tolerated  the procedure well and was taken to the postanesthesia care unit in stable condition. Sponge and instrument count correct at end of procedure.

## 2021-02-02 NOTE — Anesthesia Preprocedure Evaluation (Addendum)
Anesthesia Evaluation  Patient identified by MRN, date of birth, ID band Patient awake    Reviewed: Allergy & Precautions, NPO status , Patient's Chart, lab work & pertinent test results  History of Anesthesia Complications Negative for: history of anesthetic complications  Airway Mallampati: III   Neck ROM: Full    Dental no notable dental hx.    Pulmonary neg pulmonary ROS,    Pulmonary exam normal breath sounds clear to auscultation       Cardiovascular hypertension, Normal cardiovascular exam+ Valvular Problems/Murmurs MVP  Rhythm:Regular Rate:Normal  ECG 01/31/21:  Normal sinus rhythm Nonspecific ST abnormality  Myocardial perfusion 12/03/18:  LVEF= 78 %  Regional wall motion: reveals normal myocardial thickening and wall motion.  The overall quality of the study is good.   Artifacts noted: yes  Left ventricular cavity: normal.  Perfusion Analysis: SPECT images demonstrate homogeneous tracer distribution throughout the myocardium. Defect type : Normal   Echo 12/03/18:  NORMAL LEFT VENTRICULAR SYSTOLIC FUNCTION WITH AN ESTIMATED EF = >55 %  NORMAL RIGHT VENTRICULAR SYSTOLIC FUNCTION  MILD TRICUSPID AND MITRAL VALVE INSUFFICIENCY  NO VALVULAR STENOSIS    Neuro/Psych PSYCHIATRIC DISORDERS Depression negative neurological ROS     GI/Hepatic GERD  ,  Endo/Other  Hypothyroidism   Renal/GU negative Renal ROS     Musculoskeletal   Abdominal   Peds  Hematology Breast CA   Anesthesia Other Findings   Reproductive/Obstetrics                            Anesthesia Physical Anesthesia Plan  ASA: 2  Anesthesia Plan: General   Post-op Pain Management:    Induction: Intravenous  PONV Risk Score and Plan: 3 and Ondansetron, Dexamethasone and Treatment may vary due to age or medical condition  Airway Management Planned: LMA  Additional Equipment:   Intra-op Plan:    Post-operative Plan: Extubation in OR  Informed Consent: I have reviewed the patients History and Physical, chart, labs and discussed the procedure including the risks, benefits and alternatives for the proposed anesthesia with the patient or authorized representative who has indicated his/her understanding and acceptance.     Dental advisory given  Plan Discussed with: CRNA  Anesthesia Plan Comments: (Patient consented for risks of anesthesia including but not limited to:  - adverse reactions to medications - damage to eyes, teeth, lips or other oral mucosa - nerve damage due to positioning  - sore throat or hoarseness - damage to heart, brain, nerves, lungs, other parts of body or loss of life  Informed patient about role of CRNA in peri- and intra-operative care.  Patient voiced understanding.)       Anesthesia Quick Evaluation

## 2021-02-02 NOTE — Telephone Encounter (Signed)
02/02/21-Surgery is today per records in epic. Will wait on final path before submitted the oncotype.

## 2021-02-02 NOTE — Anesthesia Postprocedure Evaluation (Signed)
Anesthesia Post Note  Patient: Chelsea Peters  Procedure(s) Performed: PARTIAL MASTECTOMY WITH AXILLARY SENTINEL LYMPH NODE BIOPSY & RF guided (Left: Breast)  Patient location during evaluation: PACU Anesthesia Type: General Level of consciousness: awake and alert, oriented and patient cooperative Pain management: pain level controlled Vital Signs Assessment: post-procedure vital signs reviewed and stable Respiratory status: spontaneous breathing, nonlabored ventilation and respiratory function stable Cardiovascular status: blood pressure returned to baseline and stable Postop Assessment: adequate PO intake Anesthetic complications: no   No notable events documented.   Last Vitals:  Vitals:   02/02/21 1540 02/02/21 1545  BP: 133/60   Pulse: 87 83  Resp: 16 20  Temp:    SpO2: 100% 99%    Last Pain:  Vitals:   02/02/21 1535  TempSrc:   PainSc: 0-No pain                 Darrin Nipper

## 2021-02-02 NOTE — Discharge Instructions (Addendum)
Removal, Care After This sheet gives you information about how to care for yourself after your procedure. Your health care provider may also give you more specific instructions. If you have problems or questions, contact your health care provider. What can I expect after the procedure? After the procedure, it is common to have: Soreness. Bruising. Itching. Follow these instructions at home: site care Follow instructions from your health care provider about how to take care of your site. Make sure you: Wash your hands with soap and water before and after you change your bandage (dressing). If soap and water are not available, use hand sanitizer. Leave stitches (sutures), skin glue, or adhesive strips in place. These skin closures may need to stay in place for 2 weeks or longer. If adhesive strip edges start to loosen and curl up, you may trim the loose edges. Do not remove adhesive strips completely unless your health care provider tells you to do that. If the area bleeds or bruises, apply gentle pressure for 10 minutes. OK TO SHOWER IN 24HRS  Check your site every day for signs of infection. Check for: Redness, swelling, or pain. Fluid or blood. Warmth. Pus or a bad smell.  General instructions Rest and then return to your normal activities as told by your health care provider.  tylenol and advil as needed for discomfort.  Please alternate between the two every four hours as needed for pain.    Use narcotics, if prescribed, only when tylenol and motrin is not enough to control pain.  325-650mg every 8hrs to max of 3000mg/24hrs (including the 325mg in every norco dose) for the tylenol.    Advil up to 800mg per dose every 8hrs as needed for pain.   Keep all follow-up visits as told by your health care provider. This is important. Contact a health care provider if: You have redness, swelling, or pain around your site. You have fluid or blood coming from your site. Your site feels warm to  the touch. You have pus or a bad smell coming from your site. You have a fever. Your sutures, skin glue, or adhesive strips loosen or come off sooner than expected. Get help right away if: You have bleeding that does not stop with pressure or a dressing. Summary After the procedure, it is common to have some soreness, bruising, and itching at the site. Follow instructions from your health care provider about how to take care of your site. Check your site every day for signs of infection. Contact a health care provider if you have redness, swelling, or pain around your site, or your site feels warm to the touch. Keep all follow-up visits as told by your health care provider. This is important. This information is not intended to replace advice given to you by your health care provider. Make sure you discuss any questions you have with your health care provider. Document Released: 03/12/2015 Document Revised: 08/13/2017 Document Reviewed: 08/13/2017 Elsevier Interactive Patient Education  2019 [employer]  AMBULATORY SURGERY  DISCHARGE INSTRUCTIONS   The drugs that you were given will stay in your system until tomorrow so for the next 24 hours you should not:  Drive an automobile Make any legal decisions Drink any alcoholic beverage   You may resume regular meals tomorrow.  Today it is better to start with liquids and gradually work up to solid foods.  You may eat anything you prefer, but it is better to start with liquids, then soup and crackers, and gradually   work up to solid foods.   Please notify your doctor immediately if you have any unusual bleeding, trouble breathing, redness and pain at the surgery site, drainage, fever, or pain not relieved by medication.    Additional Instructions:        Please contact your physician with any problems or Same Day Surgery at [phone], Monday through Friday 6 am to 4 pm, or [place] [facility] [place] [facility] number at  [phone]. 

## 2021-02-03 ENCOUNTER — Encounter: Payer: Self-pay | Admitting: Surgery

## 2021-02-07 ENCOUNTER — Other Ambulatory Visit: Payer: Self-pay | Admitting: Surgery

## 2021-02-07 LAB — SURGICAL PATHOLOGY

## 2021-02-07 NOTE — Telephone Encounter (Signed)
Fyi- per Dr. Grayland Ormond pt is a t1a, no oncotype needed

## 2021-02-07 NOTE — Telephone Encounter (Signed)
I spoke with Dr. Grayland Ormond. Oncotype not needed at this time due to early stage breast cancer.

## 2021-02-10 ENCOUNTER — Ambulatory Visit
Admission: RE | Admit: 2021-02-10 | Discharge: 2021-02-10 | Disposition: A | Discharge status: Home / Self Care | Payer: Medicare HMO | Source: Ambulatory Visit | Attending: Radiation Oncology | Admitting: Radiation Oncology

## 2021-02-10 ENCOUNTER — Encounter: Payer: Self-pay | Admitting: Oncology

## 2021-02-10 ENCOUNTER — Other Ambulatory Visit: Payer: Self-pay

## 2021-02-10 ENCOUNTER — Inpatient Hospital Stay: Payer: Medicare HMO | Attending: Oncology | Admitting: Oncology

## 2021-02-10 VITALS — BP 171/88 | HR 69 | Temp 97.2°F | Ht 61.0 in | Wt 113.7 lb

## 2021-02-10 DIAGNOSIS — C50912 Malignant neoplasm of unspecified site of left female breast: Secondary | ICD-10-CM | POA: Diagnosis not present

## 2021-02-10 DIAGNOSIS — C50512 Malignant neoplasm of lower-outer quadrant of left female breast: Secondary | ICD-10-CM | POA: Diagnosis not present

## 2021-02-10 DIAGNOSIS — Z79810 Long term (current) use of selective estrogen receptor modulators (SERMs): Secondary | ICD-10-CM | POA: Diagnosis not present

## 2021-02-10 DIAGNOSIS — C50911 Malignant neoplasm of unspecified site of right female breast: Secondary | ICD-10-CM | POA: Insufficient documentation

## 2021-02-10 DIAGNOSIS — Z170 Estrogen receptor positive status [ER+]: Secondary | ICD-10-CM | POA: Insufficient documentation

## 2021-02-10 DIAGNOSIS — Z79899 Other long term (current) drug therapy: Secondary | ICD-10-CM | POA: Insufficient documentation

## 2021-02-10 DIAGNOSIS — E039 Hypothyroidism, unspecified: Secondary | ICD-10-CM | POA: Diagnosis not present

## 2021-02-10 DIAGNOSIS — D0511 Intraductal carcinoma in situ of right breast: Secondary | ICD-10-CM | POA: Insufficient documentation

## 2021-02-10 NOTE — Progress Notes (Signed)
Pt states gets pain and swelling under lt arm pit.

## 2021-02-10 NOTE — Progress Notes (Signed)
Radiation Oncology Follow up Note old patient new area left sided breast cancer  Name: Chelsea Peters   Date:   02/10/2021 MRN:  [id number] DOB: February 20, 1941    [name] presents to the clinic today for evaluation of a stage Ia (T1 a N0 M0).  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  She has done well in that term although recently showed indeterminate focal asymmetry associated with distortion in the left breast in the lower outer quadrant which warranted stereotactic guided biopsy.  Biopsy was positive for invasive mammary carcinoma of no special type.  Patient underwent wide local excision showing invasive mammary carcinoma no special type overall grade 1 tumor size was 3 mm.  Margins were clear but close at 1 mm.  4 sentinel nodes were examined all negative for metastatic disease.  She has done well postoperatively although has significant ecchymosis of the left breast.  She specifically denies tenderness cough or bone pain.  She is now referred to radiation oncology for consideration of treatment.  She has been seen by medical oncology will be offered aromatase inhibitor after completion of radiation.  COMPLICATIONS OF TREATMENT: none  FOLLOW UP COMPLIANCE: keeps appointments   PHYSICAL EXAM:  There were no vitals taken for this visit. Left breast has ecchymosis and some swelling from her recent surgery.  No dominant masses noted in either breast.  No axillary or supraclavicular adenopathy is identified she does have a seroma in her left axilla.  Well-developed well-nourished patient in NAD. HEENT reveals PERLA, EOMI, discs not visualized.  Oral cavity is clear. No oral mucosal lesions are  identified. Neck is clear without evidence of cervical or supraclavicular adenopathy. Lungs are clear to A&P. Cardiac examination is essentially unremarkable with regular rate and rhythm without murmur rub or thrill. Abdomen is benign with no organomegaly or masses noted. Motor sensory and DTR levels are equal and symmetric in the upper and lower extremities. Cranial nerves II through XII are grossly intact. Proprioception is intact. No peripheral adenopathy or edema is identified. No motor or sensory levels are noted. Crude visual fields are within normal range.  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  This time I have recommended hypofractionated course of radiation therapy to her left breast over 3 weeks.  Would also boost her scar another 1600 centigrade based on the 1 mm margin.  Risks and benefits of treatment including skin reaction fatigue alteration of blood counts possible inclusion of superficial lung all were discussed in detail with the patient.  She comprehends my treatment plan well.  I have put her out about 2 weeks to allow some further healing.  Patient also will be a candidate for aromatase inhibitor treatment after completion of radiation.  I would like to take this opportunity to thank you for allowing me to participate in the care of your patient.Noreene Filbert, MD

## 2021-02-10 NOTE — Progress Notes (Signed)
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  INTERVAL HISTORY: Patient returns to clinic today for further evaluation, discussion of her final pathology results, and treatment planning.  She has tenderness at her surgical site, but otherwise feels well. She has no neurologic complaints.  She denies any recent fevers or illnesses.  She has a good appetite and denies weight loss.  She has no chest pain, shortness of breath, cough, or hemoptysis.  She denies any nausea, vomiting, constipation, or diarrhea.  She has no urinary complaints.  Patient offers no further specific complaints today.  REVIEW OF SYSTEMS:   Review of Systems  Constitutional: Negative.  Negative for fever and malaise/fatigue.  Respiratory: Negative.  Negative for cough, hemoptysis and shortness of breath.   Cardiovascular: Negative.  Negative for chest pain and leg swelling.  Gastrointestinal: Negative.  Negative for abdominal pain.  Genitourinary: Negative.  Negative for dysuria.  Musculoskeletal: Negative.  Negative for back pain.  Skin: Negative.  Negative for rash.  Neurological: Negative.  Negative for dizziness, sensory change, focal weakness, weakness and headaches.  Psychiatric/Behavioral: Negative.  The patient is not nervous/anxious.    As per HPI. Otherwise, a complete review of systems is negative.  PAST MEDICAL HISTORY: Past Medical History:  Diagnosis Date   Breast cancer  ([place]) 01/2019   right breast ca DCIS   COVID-19 09/2018   GERD (gastroesophageal reflux disease)    Hyperlipidemia    Hypertension    Hypothyroidism    Lumbar disc disease    MVP (mitral valve prolapse)    Personal history of radiation therapy    right breast ca. Finished 3/021   Pneumonia 09/2018   pt states she was positive for covid then as well    PAST SURGICAL HISTORY: Past Surgical History:  Procedure Laterality Date   ABDOMINAL HYSTERECTOMY     BREAST BIOPSY Right 01/08/2019   Affirm Biopsy- X-Clip- DCIS   BREAST BIOPSY Left 01/06/2021   stereo bx-asymmetry-"coil" clip-[facility]   BREAST LUMPECTOMY Right 02/13/2019   DCIS, negative LN,  close margins of 1 mm lateral   CARDIAC CATHETERIZATION N/A 06/02/2015   Procedure: Left Heart Cath and Coronary Angiography;  Surgeon: Teodoro Spray, MD;  Location: [facility];  Service: Cardiovascular;  Laterality: N/A;   COLONOSCOPY WITH PROPOFOL N/A 01/27/2019   Procedure: COLONOSCOPY WITH PROPOFOL;  Surgeon: Toledo, Benay Pike, MD;  Location: [facility];  Service: Gastroenterology;  Laterality: N/A;   ESOPHAGOGASTRODUODENOSCOPY (EGD) WITH PROPOFOL N/A 01/27/2019   Procedure: ESOPHAGOGASTRODUODENOSCOPY (EGD) WITH PROPOFOL;  Surgeon: Toledo, Benay Pike, MD;  Location: [facility];  Service: Gastroenterology;  Laterality: N/A;   PARTIAL MASTECTOMY WITH AXILLARY SENTINEL LYMPH NODE BIOPSY Right 02/13/2019   Procedure: PARTIAL MASTECTOMY WITH AXILLARY SENTINEL LYMPH NODE BIOPSY;  Surgeon: Benjamine Sprague, DO;  Location: [facility];  Service: General;  Laterality: Right;   PARTIAL MASTECTOMY WITH AXILLARY SENTINEL LYMPH NODE BIOPSY Left 02/02/2021   Procedure: PARTIAL MASTECTOMY WITH AXILLARY SENTINEL LYMPH NODE BIOPSY & RF guided;  Surgeon: Benjamine Sprague, DO;  Location:  [facility];  Service: General;  Laterality: Left;   STOMACH SURGERY     growth removed also removed gallbladder   THYROID SURGERY      FAMILY HISTORY: Family History   Problem Relation Age of Onset   Breast cancer Mother 51   Breast cancer Maternal Aunt    Breast cancer Maternal Aunt    Stomach cancer Other    Throat cancer Other     ADVANCED DIRECTIVES (Y/N):  N  HEALTH MAINTENANCE: Social History   Tobacco Use   Smoking status: Never   Smokeless tobacco: Never  Vaping Use   Vaping Use: Never used  Substance Use Topics   Alcohol use: Not Currently   Drug use: No     Colonoscopy:  PAP:  Bone density:  Lipid panel:  Allergies  Allergen Reactions   Ambien [Zolpidem] Other (See Comments)    erratic behavior/"made me crazy"   Latex Itching    redness   Paxil [Paroxetine] Other (See Comments)    Stomach pains.   Penicillins Other (See Comments)    Blisters in nose Did it involve swelling of the face/tongue/throat, SOB, or low BP? No Did it involve sudden or severe rash/hives, skin peeling, or any reaction on the inside of your mouth or nose? Yes--blisters in nose Did you need to seek medical attention at a hospital or doctor's office? No When did it last happen? More than 10 years ago    If all above answers are NO, may proceed with cephalosporin use.     Current Outpatient Medications  Medication Sig Dispense Refill   acetaminophen (TYLENOL) 325 MG tablet Take 2 tablets (650 mg total) by mouth every 8 (eight) hours as needed for mild pain. 40 tablet 0   ALPRAZolam (XANAX) 0.25 MG tablet Take 0.25 mg by mouth daily as needed for anxiety.     amLODipine (NORVASC) 5 MG tablet Take 5 mg by mouth every morning.   3   Biotin 1 MG CAPS Take 1 mg by mouth daily.     Cholecalciferol 50 MCG (2000 UT) TABS Take 2,000 Units by mouth daily.      docusate sodium (COLACE) 100 MG capsule Take 1 capsule (100 mg total) by mouth 2 (two) times daily as needed for up to 10 days for mild constipation. 20 capsule 0   ibuprofen (ADVIL) 400 MG tablet Take 1 tablet (400 mg total) by mouth every 8 (eight) hours as needed for mild pain or moderate pain.  30 tablet 0   omeprazole (PRILOSEC) 20 MG capsule Take 20 mg by mouth every morning.   0   tamoxifen (NOLVADEX) 20 MG tablet TAKE 1 TABLET(20 MG) BY MOUTH DAILY 90 tablet 3   HYDROcodone-acetaminophen (NORCO) 5-325 MG tablet Take 1 tablet by mouth every 6 (six) hours as needed for up to 6 doses for moderate pain. (Patient not taking: Reported on 02/10/2021) 6 tablet 0   No current facility-administered medications for this visit.    OBJECTIVE: Vitals:   02/10/21 1018  BP: (!) 171/88  Pulse: 69  Temp: (!) 97.2 F (36.2 C)  SpO2: 100%     Body mass index is 21.48 kg/m.    ECOG FS:0 - Asymptomatic  General: Well-developed, well-nourished, no acute distress. Eyes: Pink conjunctiva, anicteric sclera. HEENT: Normocephalic, moist mucous membranes. Breast: Left lumpectomy. Lungs: No audible wheezing or coughing. Heart: Regular rate and rhythm. Abdomen: Soft, nontender, no obvious distention. Musculoskeletal: No edema, cyanosis, or clubbing. Neuro: Alert, answering all questions  appropriately. Cranial nerves grossly intact. Skin: No rashes or petechiae noted. Psych: Normal affect.   LAB RESULTS:  Lab Results  Component Value Date   NA 141 08/02/2020   K 3.9 08/02/2020   CL 103 08/02/2020   CO2 27 08/02/2020   GLUCOSE 117 (H) 08/02/2020   BUN 19 08/02/2020   CREATININE 0.87 08/02/2020   CALCIUM 9.2 08/02/2020   PROT 6.7 08/02/2020   ALBUMIN 4.0 08/02/2020   AST 24 08/02/2020   ALT 18 08/02/2020   ALKPHOS 47 08/02/2020   BILITOT 0.6 08/02/2020   GFRNONAA >60 08/02/2020   GFRAA >60 10/05/2018    Lab Results  Component Value Date   WBC 9.5 08/02/2020   NEUTROABS 2.9 05/19/2015   HGB 13.8 08/02/2020   HCT 39.7 08/02/2020   MCV 93.9 08/02/2020   PLT 226 08/02/2020     STUDIES: MR BREAST BILATERAL W WO CONTRAST INC CAD  Result Date: 01/27/2021 CLINICAL DATA:  Left breast invasive mammary carcinoma diagnosed on stereotactic guided core needle biopsy on 01/06/2021.  Previous right lumpectomy for breast cancer on 02/13/2019. EXAM: BILATERAL BREAST MRI WITH AND WITHOUT CONTRAST TECHNIQUE: Multiplanar, multisequence MR images of both breasts were obtained prior to and following the intravenous administration of 5 ml of Gadavist Three-dimensional MR images were rendered by post-processing of the original MR data on an independent workstation. The three-dimensional MR images were interpreted, and findings are reported in the following complete MRI report for this study. Three dimensional images were evaluated at the independent interpreting workstation using the DynaCAD thin client. COMPARISON:  Bilateral diagnostic mammogram dated 12/30/2020, left breast stereotactic guided core needle biopsy and post biopsy mammogram dated 01/07/2021. FINDINGS: Breast composition: c. Heterogeneous fibroglandular tissue. Background parenchymal enhancement: Mild. Right breast: Post lumpectomy and postradiation changes. No mass or enhancement suspicious for malignancy. Left breast: Biopsy marker clip artifact in the posterior lower outer quadrant of the left breast. Multiple enhancing masses are arranged in a transverse linear pattern at that location of the breast and extending into the lower inner quadrant. These have a mixture of enhancement kinetics, including areas of rapid wash-in/washout. These span an area measuring 5.7 cm in transverse dimension, 1.4 cm in AP dimension and 3.0 cm in cephalo caudal dimension. The biopsy marker clip artifact is within the central portion of these masses. Lymph nodes: No abnormal appearing lymph nodes. Ancillary findings:  None. IMPRESSION: 1. Recently biopsied invasive mammary carcinoma in the posteroinferior left breast spanning 5.7 x 3.0 x 1.4 cm and involving the lower outer and lower inner quadrants. 2. Post lumpectomy and postradiation changes on the right with no evidence of malignancy on the right. 3. No adenopathy. RECOMMENDATION: Treatment plan.  BI-RADS CATEGORY  6: Known biopsy-proven malignancy. Electronically Signed   By: Claudie Revering M.D.   On: 01/27/2021 12:10  NM Sentinel Node Inj-No Rpt (Breast)  Result Date: 02/02/2021 Sulfur Colloid was injected by the Nuclear Medicine Technologist for sentinel lymph node localization.   MM Breast Surgical Specimen  Result Date: 02/02/2021 CLINICAL DATA:  Status post RF tag localized LEFT breast lumpectomy. COIL clip in center of enhancement/possible post biopsy changes noted on recent MRI. EXAM: SPECIMEN RADIOGRAPH OF THE LEFT BREAST COMPARISON:  Previous exam(s). FINDINGS: Status post excision of the LEFT breast. The RF ID tag and COIL shaped clip are present within the specimen. IMPRESSION: Specimen radiograph of the LEFT breast. Electronically Signed   By: Valentino Saxon M.D.   On: 02/02/2021 14:30  MM LT RADIO FREQUENCY  TAG LOC MAMMO GUIDE  Result Date: 01/31/2021 CLINICAL DATA:  80 year old female with biopsy-proven left breast invasive mammary carcinoma. EXAM: MAMMOGRAPHIC GUIDED RADIOFREQUENCY DEVICE LOCALIZATION OF THE LEFT BREAST COMPARISON:  Previous exam(s) FINDINGS: Patient presents for radiofrequency device localization prior to left breast lumpectomy. I met with the patient and we discussed the procedure of radiofrequency device localization including benefits and alternatives. We discussed the high likelihood of a successful procedure. We discussed the risks of the procedure including infection, bleeding, tissue injury and further surgery. Informed, written consent was given. The usual time-out protocol was performed immediately prior to the procedure. Using mammographic guidance, sterile technique, 1% lidocaine as local anesthesia, a radiofrequency tag was used to localize a coil shaped left breast clip using a lateral approach. The follow-up mammogram images confirm that the RF device is in the expected location and are marked for Dr. Lysle Pearl. Follow-up survey of the patient confirms  the presence of the RF device. The patient tolerated the procedure well and was [facility] from [facility]. IMPRESSION: Radiofrequency device localization of the left breast. No apparent complications. Electronically Signed   By: Kristopher Oppenheim M.D.   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  Patient underwent lumpectomy on February 02, 2021 confirming stage of disease.  Given the fact that the tumor size was a T1a, she does not require Oncotype testing or chemotherapy.  She has an appointment with radiation oncology later today.  At the conclusion of her XRT, patient will benefit from letrozole for a total of 5 years.  Return to clinic at the end of her radiation for further evaluation and initiation of treatment.  DCIS, right breast: Patient underwent lumpectomy on February 13, 2019.  Although patient had close margins of 1 mm, it was determined that reexcision is not necessary.  She proceeded directly with adjuvant XRT completing in March 2021.  Discontinue tamoxifen as above.  I spent a total of 20 minutes reviewing chart data, face-to-face evaluation with the patient, counseling and coordination of care as detailed above.    Patient expressed understanding and was in agreement with this plan. She also understands that She can call clinic at any time with any questions, concerns, or complaints.    Cancer Staging  Ductal carcinoma in situ (DCIS) of right breast Staging form: Breast, AJCC 8th Edition - Clinical stage from 01/16/2019: Stage 0 (cTis (DCIS), cN0, cM0, ER+, PR+, HER2-) - Signed by Lloyd Huger, MD on 01/16/2019 Stage prefix: Initial diagnosis  Invasive  ductal carcinoma of left breast [facility]) Staging form: Breast, AJCC 8th Edition - Pathologic stage from 02/10/2021: Stage IA (pT1a, pN0, cM0, G1, ER+, PR+, HER2-) - Signed by Lloyd Huger, MD on 02/10/2021 Stage prefix: Initial diagnosis Histologic grading system: 3 grade system   Lloyd Huger, MD   02/10/2021 5:13 PM

## 2021-03-02 ENCOUNTER — Ambulatory Visit
Admission: RE | Admit: 2021-03-02 | Discharge: 2021-03-02 | Disposition: A | Discharge status: Home / Self Care | Payer: Medicare HMO | Source: Ambulatory Visit | Attending: Radiation Oncology | Admitting: Radiation Oncology

## 2021-03-02 DIAGNOSIS — Z510 Encounter for antineoplastic radiation therapy: Secondary | ICD-10-CM | POA: Insufficient documentation

## 2021-03-02 DIAGNOSIS — Z170 Estrogen receptor positive status [ER+]: Secondary | ICD-10-CM | POA: Diagnosis not present

## 2021-03-02 DIAGNOSIS — D0511 Intraductal carcinoma in situ of right breast: Secondary | ICD-10-CM | POA: Diagnosis not present

## 2021-03-02 DIAGNOSIS — C50512 Malignant neoplasm of lower-outer quadrant of left female breast: Secondary | ICD-10-CM | POA: Diagnosis not present

## 2021-03-03 DIAGNOSIS — Z170 Estrogen receptor positive status [ER+]: Secondary | ICD-10-CM | POA: Diagnosis not present

## 2021-03-03 DIAGNOSIS — Z510 Encounter for antineoplastic radiation therapy: Secondary | ICD-10-CM | POA: Diagnosis not present

## 2021-03-03 DIAGNOSIS — D0511 Intraductal carcinoma in situ of right breast: Secondary | ICD-10-CM | POA: Diagnosis not present

## 2021-03-03 DIAGNOSIS — C50512 Malignant neoplasm of lower-outer quadrant of left female breast: Secondary | ICD-10-CM | POA: Diagnosis not present

## 2021-03-04 ENCOUNTER — Other Ambulatory Visit: Payer: Self-pay | Admitting: *Deleted

## 2021-03-04 DIAGNOSIS — D0511 Intraductal carcinoma in situ of right breast: Secondary | ICD-10-CM

## 2021-03-09 ENCOUNTER — Ambulatory Visit: Admission: RE | Admit: 2021-03-09 | Payer: Medicare HMO | Source: Ambulatory Visit

## 2021-03-09 DIAGNOSIS — Z170 Estrogen receptor positive status [ER+]: Secondary | ICD-10-CM | POA: Diagnosis not present

## 2021-03-09 DIAGNOSIS — C50512 Malignant neoplasm of lower-outer quadrant of left female breast: Secondary | ICD-10-CM | POA: Diagnosis not present

## 2021-03-09 DIAGNOSIS — Z510 Encounter for antineoplastic radiation therapy: Secondary | ICD-10-CM | POA: Diagnosis not present

## 2021-03-09 DIAGNOSIS — D0511 Intraductal carcinoma in situ of right breast: Secondary | ICD-10-CM | POA: Diagnosis not present

## 2021-03-10 ENCOUNTER — Ambulatory Visit
Admission: RE | Admit: 2021-03-10 | Discharge: 2021-03-10 | Disposition: A | Discharge status: Home / Self Care | Payer: Medicare HMO | Source: Ambulatory Visit | Attending: Radiation Oncology | Admitting: Radiation Oncology

## 2021-03-10 DIAGNOSIS — D0511 Intraductal carcinoma in situ of right breast: Secondary | ICD-10-CM | POA: Diagnosis not present

## 2021-03-10 DIAGNOSIS — Z510 Encounter for antineoplastic radiation therapy: Secondary | ICD-10-CM | POA: Diagnosis not present

## 2021-03-10 DIAGNOSIS — C50512 Malignant neoplasm of lower-outer quadrant of left female breast: Secondary | ICD-10-CM | POA: Diagnosis not present

## 2021-03-10 DIAGNOSIS — Z170 Estrogen receptor positive status [ER+]: Secondary | ICD-10-CM | POA: Diagnosis not present

## 2021-03-11 ENCOUNTER — Ambulatory Visit
Admission: RE | Admit: 2021-03-11 | Discharge: 2021-03-11 | Disposition: A | Discharge status: Home / Self Care | Payer: Medicare HMO | Source: Ambulatory Visit | Attending: Radiation Oncology | Admitting: Radiation Oncology

## 2021-03-11 DIAGNOSIS — Z170 Estrogen receptor positive status [ER+]: Secondary | ICD-10-CM | POA: Diagnosis not present

## 2021-03-11 DIAGNOSIS — C50512 Malignant neoplasm of lower-outer quadrant of left female breast: Secondary | ICD-10-CM | POA: Diagnosis not present

## 2021-03-11 DIAGNOSIS — Z510 Encounter for antineoplastic radiation therapy: Secondary | ICD-10-CM | POA: Diagnosis not present

## 2021-03-11 DIAGNOSIS — D0511 Intraductal carcinoma in situ of right breast: Secondary | ICD-10-CM | POA: Diagnosis not present

## 2021-03-14 ENCOUNTER — Other Ambulatory Visit: Payer: Self-pay | Admitting: *Deleted

## 2021-03-14 ENCOUNTER — Inpatient Hospital Stay: Payer: Medicare HMO

## 2021-03-14 ENCOUNTER — Ambulatory Visit
Admission: RE | Admit: 2021-03-14 | Discharge: 2021-03-14 | Disposition: A | Discharge status: Home / Self Care | Payer: Medicare HMO | Source: Ambulatory Visit | Attending: Radiation Oncology | Admitting: Radiation Oncology

## 2021-03-14 DIAGNOSIS — D0511 Intraductal carcinoma in situ of right breast: Secondary | ICD-10-CM | POA: Insufficient documentation

## 2021-03-14 DIAGNOSIS — Z170 Estrogen receptor positive status [ER+]: Secondary | ICD-10-CM | POA: Insufficient documentation

## 2021-03-14 DIAGNOSIS — Z510 Encounter for antineoplastic radiation therapy: Secondary | ICD-10-CM | POA: Diagnosis not present

## 2021-03-14 DIAGNOSIS — C50512 Malignant neoplasm of lower-outer quadrant of left female breast: Secondary | ICD-10-CM | POA: Diagnosis not present

## 2021-03-15 ENCOUNTER — Ambulatory Visit
Admission: RE | Admit: 2021-03-15 | Discharge: 2021-03-15 | Disposition: A | Discharge status: Home / Self Care | Payer: Medicare HMO | Source: Ambulatory Visit | Attending: Radiation Oncology | Admitting: Radiation Oncology

## 2021-03-15 DIAGNOSIS — C50512 Malignant neoplasm of lower-outer quadrant of left female breast: Secondary | ICD-10-CM | POA: Diagnosis not present

## 2021-03-15 DIAGNOSIS — D0511 Intraductal carcinoma in situ of right breast: Secondary | ICD-10-CM | POA: Diagnosis not present

## 2021-03-15 DIAGNOSIS — Z170 Estrogen receptor positive status [ER+]: Secondary | ICD-10-CM | POA: Diagnosis not present

## 2021-03-15 DIAGNOSIS — Z510 Encounter for antineoplastic radiation therapy: Secondary | ICD-10-CM | POA: Diagnosis not present

## 2021-03-16 ENCOUNTER — Encounter: Payer: Self-pay | Admitting: Licensed Clinical Social Worker

## 2021-03-16 ENCOUNTER — Ambulatory Visit
Admission: RE | Admit: 2021-03-16 | Discharge: 2021-03-16 | Disposition: A | Discharge status: Home / Self Care | Payer: Medicare HMO | Source: Ambulatory Visit | Attending: Radiation Oncology | Admitting: Radiation Oncology

## 2021-03-16 DIAGNOSIS — Z170 Estrogen receptor positive status [ER+]: Secondary | ICD-10-CM | POA: Diagnosis not present

## 2021-03-16 DIAGNOSIS — C50512 Malignant neoplasm of lower-outer quadrant of left female breast: Secondary | ICD-10-CM | POA: Diagnosis not present

## 2021-03-16 DIAGNOSIS — Z510 Encounter for antineoplastic radiation therapy: Secondary | ICD-10-CM | POA: Diagnosis not present

## 2021-03-16 DIAGNOSIS — D0511 Intraductal carcinoma in situ of right breast: Secondary | ICD-10-CM | POA: Diagnosis not present

## 2021-03-16 NOTE — Progress Notes (Signed)
[place] Work  Clinical Social Work was referred by Dr. Baruch Gouty for assessment of psychosocial needs.  Clinical Social Worker contacted patient by phone  to offer support and assess for needs.  CSW left voicemail with contact information and request for return call.      Chelsea Peters, [place]       First attempt

## 2021-03-17 ENCOUNTER — Ambulatory Visit
Admission: RE | Admit: 2021-03-17 | Discharge: 2021-03-17 | Disposition: A | Discharge status: Home / Self Care | Payer: Medicare HMO | Source: Ambulatory Visit | Attending: Radiation Oncology | Admitting: Radiation Oncology

## 2021-03-17 DIAGNOSIS — D0511 Intraductal carcinoma in situ of right breast: Secondary | ICD-10-CM | POA: Diagnosis not present

## 2021-03-17 DIAGNOSIS — Z510 Encounter for antineoplastic radiation therapy: Secondary | ICD-10-CM | POA: Diagnosis not present

## 2021-03-17 DIAGNOSIS — Z170 Estrogen receptor positive status [ER+]: Secondary | ICD-10-CM | POA: Diagnosis not present

## 2021-03-17 DIAGNOSIS — C50512 Malignant neoplasm of lower-outer quadrant of left female breast: Secondary | ICD-10-CM | POA: Diagnosis not present

## 2021-03-18 ENCOUNTER — Ambulatory Visit
Admission: RE | Admit: 2021-03-18 | Discharge: 2021-03-18 | Disposition: A | Discharge status: Home / Self Care | Payer: Medicare HMO | Source: Ambulatory Visit | Attending: Radiation Oncology | Admitting: Radiation Oncology

## 2021-03-18 DIAGNOSIS — D0511 Intraductal carcinoma in situ of right breast: Secondary | ICD-10-CM | POA: Diagnosis not present

## 2021-03-18 DIAGNOSIS — C50512 Malignant neoplasm of lower-outer quadrant of left female breast: Secondary | ICD-10-CM | POA: Diagnosis not present

## 2021-03-18 DIAGNOSIS — Z170 Estrogen receptor positive status [ER+]: Secondary | ICD-10-CM | POA: Diagnosis not present

## 2021-03-18 DIAGNOSIS — Z510 Encounter for antineoplastic radiation therapy: Secondary | ICD-10-CM | POA: Diagnosis not present

## 2021-03-21 ENCOUNTER — Ambulatory Visit
Admission: RE | Admit: 2021-03-21 | Discharge: 2021-03-21 | Disposition: A | Discharge status: Home / Self Care | Payer: Medicare HMO | Source: Ambulatory Visit | Attending: Radiation Oncology | Admitting: Radiation Oncology

## 2021-03-21 DIAGNOSIS — C50512 Malignant neoplasm of lower-outer quadrant of left female breast: Secondary | ICD-10-CM | POA: Diagnosis not present

## 2021-03-21 DIAGNOSIS — Z510 Encounter for antineoplastic radiation therapy: Secondary | ICD-10-CM | POA: Diagnosis not present

## 2021-03-22 ENCOUNTER — Ambulatory Visit
Admission: RE | Admit: 2021-03-22 | Discharge: 2021-03-22 | Disposition: A | Discharge status: Home / Self Care | Payer: Medicare HMO | Source: Ambulatory Visit | Attending: Radiation Oncology | Admitting: Radiation Oncology

## 2021-03-22 ENCOUNTER — Other Ambulatory Visit: Payer: Self-pay | Admitting: *Deleted

## 2021-03-22 DIAGNOSIS — Z510 Encounter for antineoplastic radiation therapy: Secondary | ICD-10-CM | POA: Diagnosis not present

## 2021-03-22 DIAGNOSIS — C50512 Malignant neoplasm of lower-outer quadrant of left female breast: Secondary | ICD-10-CM | POA: Diagnosis not present

## 2021-03-22 MED ORDER — AZITHROMYCIN 250 MG PO TABS: ORAL_TABLET | ORAL | 0 refills | Status: DC

## 2021-03-23 ENCOUNTER — Ambulatory Visit
Admission: RE | Admit: 2021-03-23 | Discharge: 2021-03-23 | Disposition: A | Discharge status: Home / Self Care | Payer: Medicare HMO | Source: Ambulatory Visit | Attending: Radiation Oncology | Admitting: Radiation Oncology

## 2021-03-23 DIAGNOSIS — C50512 Malignant neoplasm of lower-outer quadrant of left female breast: Secondary | ICD-10-CM | POA: Diagnosis not present

## 2021-03-23 DIAGNOSIS — Z510 Encounter for antineoplastic radiation therapy: Secondary | ICD-10-CM | POA: Diagnosis not present

## 2021-03-24 ENCOUNTER — Ambulatory Visit
Admission: RE | Admit: 2021-03-24 | Discharge: 2021-03-24 | Disposition: A | Discharge status: Home / Self Care | Payer: Medicare HMO | Source: Ambulatory Visit | Attending: Radiation Oncology | Admitting: Radiation Oncology

## 2021-03-24 DIAGNOSIS — Z170 Estrogen receptor positive status [ER+]: Secondary | ICD-10-CM | POA: Diagnosis not present

## 2021-03-24 DIAGNOSIS — Z510 Encounter for antineoplastic radiation therapy: Secondary | ICD-10-CM | POA: Diagnosis not present

## 2021-03-24 DIAGNOSIS — D0511 Intraductal carcinoma in situ of right breast: Secondary | ICD-10-CM | POA: Diagnosis not present

## 2021-03-24 DIAGNOSIS — C50512 Malignant neoplasm of lower-outer quadrant of left female breast: Secondary | ICD-10-CM | POA: Diagnosis not present

## 2021-03-25 ENCOUNTER — Ambulatory Visit
Admission: RE | Admit: 2021-03-25 | Discharge: 2021-03-25 | Disposition: A | Discharge status: Home / Self Care | Payer: Medicare HMO | Source: Ambulatory Visit | Attending: Radiation Oncology | Admitting: Radiation Oncology

## 2021-03-25 ENCOUNTER — Other Ambulatory Visit: Payer: Self-pay | Admitting: *Deleted

## 2021-03-25 DIAGNOSIS — Z170 Estrogen receptor positive status [ER+]: Secondary | ICD-10-CM | POA: Diagnosis not present

## 2021-03-25 DIAGNOSIS — C50512 Malignant neoplasm of lower-outer quadrant of left female breast: Secondary | ICD-10-CM | POA: Diagnosis not present

## 2021-03-25 DIAGNOSIS — D0511 Intraductal carcinoma in situ of right breast: Secondary | ICD-10-CM | POA: Diagnosis not present

## 2021-03-25 DIAGNOSIS — Z510 Encounter for antineoplastic radiation therapy: Secondary | ICD-10-CM | POA: Diagnosis not present

## 2021-03-28 ENCOUNTER — Inpatient Hospital Stay: Payer: Medicare HMO

## 2021-03-28 ENCOUNTER — Other Ambulatory Visit: Payer: Self-pay

## 2021-03-28 ENCOUNTER — Encounter: Payer: Self-pay | Admitting: Licensed Clinical Social Worker

## 2021-03-28 ENCOUNTER — Ambulatory Visit
Admission: RE | Admit: 2021-03-28 | Discharge: 2021-03-28 | Disposition: A | Discharge status: Home / Self Care | Payer: Medicare HMO | Source: Ambulatory Visit | Attending: Radiation Oncology | Admitting: Radiation Oncology

## 2021-03-28 DIAGNOSIS — Z170 Estrogen receptor positive status [ER+]: Secondary | ICD-10-CM | POA: Diagnosis not present

## 2021-03-28 DIAGNOSIS — C50512 Malignant neoplasm of lower-outer quadrant of left female breast: Secondary | ICD-10-CM | POA: Diagnosis not present

## 2021-03-28 DIAGNOSIS — D0511 Intraductal carcinoma in situ of right breast: Secondary | ICD-10-CM | POA: Diagnosis not present

## 2021-03-28 DIAGNOSIS — Z510 Encounter for antineoplastic radiation therapy: Secondary | ICD-10-CM | POA: Diagnosis not present

## 2021-03-28 LAB — CBC
HCT: 39.3 % (ref 36.0–46.0)
Hemoglobin: 13.3 g/dL (ref 12.0–15.0)
MCH: 31.7 pg (ref 26.0–34.0)
MCHC: 33.8 g/dL (ref 30.0–36.0)
MCV: 93.6 fL (ref 80.0–100.0)
Platelets: 246 10*3/uL (ref 150–400)
RBC: 4.2 MIL/uL (ref 3.87–5.11)
RDW: 12.9 % (ref 11.5–15.5)
WBC: 4.6 10*3/uL (ref 4.0–10.5)
nRBC: 0 % (ref 0.0–0.2)

## 2021-03-28 NOTE — Progress Notes (Signed)
[facility]  Initial Assessment   Chelsea Peters is a 81 y.o. year old female contacted by phone. Clinical Social Work was referred by Dr. Baruch Gouty for assessment of psychosocial needs.   SDOH (Social Determinants of Health) assessments performed: No   Distress Screen completed: No No flowsheet data found.    Family/Social Information:  Housing Arrangement: patient lives with spouse  Chelsea, Peters (Spouse) [phone] (Mobile) Family members/support persons in your life? Family, Friends/Colleagues, and [employer] concerns: no  Employment: Retired. Income source: Paediatric nurse concerns: No Type of concern: None Food access concerns: no Religious or spiritual practice: yes Services Currently in place:  [employer]  Coping/ Adjustment to diagnosis: Patient understands treatment plan and what happens next? yes Concerns about diagnosis and/or treatment: Feelings of anger or sadness and How I will care for other members of my family Patient reported stressors: Depression, Anxiety, and Adjusting to my illness Hopes and priorities: To get through this, I want to be able to take care of my husband. Patient enjoys spending time with family Current coping skills/ strengths: Ability for insight , Average or above average intelligence , Capable of independent living , Religious Affiliation , and Supportive family/friends     SUMMARY: Current SDOH Barriers:  Mental Health Concerns   Clinical Social Work Clinical Goal(s):  patient will work with SW to address concerns related to feelings of anxiety and depression  Interventions: Discussed common feeling and emotions when being diagnosed with cancer, and the importance of support during treatment Informed patient of the support team roles and support services at [facility] Provided CSW contact information and encouraged patient to call with any questions or concerns Provided patient with  information about the different resources available and what CSW can assist with during treatment and community resources   Follow Up Plan: Patient will contact CSW with any support or resource needs Patient verbalizes understanding of plan: Yes    Adelene Amas , LCSW

## 2021-03-29 ENCOUNTER — Ambulatory Visit
Admission: RE | Admit: 2021-03-29 | Discharge: 2021-03-29 | Disposition: A | Discharge status: Home / Self Care | Payer: Medicare HMO | Source: Ambulatory Visit | Attending: Radiation Oncology | Admitting: Radiation Oncology

## 2021-03-29 DIAGNOSIS — Z510 Encounter for antineoplastic radiation therapy: Secondary | ICD-10-CM | POA: Diagnosis not present

## 2021-03-29 DIAGNOSIS — C50512 Malignant neoplasm of lower-outer quadrant of left female breast: Secondary | ICD-10-CM | POA: Diagnosis not present

## 2021-03-29 DIAGNOSIS — Z170 Estrogen receptor positive status [ER+]: Secondary | ICD-10-CM | POA: Diagnosis not present

## 2021-03-29 DIAGNOSIS — D0511 Intraductal carcinoma in situ of right breast: Secondary | ICD-10-CM | POA: Diagnosis not present

## 2021-03-30 ENCOUNTER — Ambulatory Visit
Admission: RE | Admit: 2021-03-30 | Discharge: 2021-03-30 | Disposition: A | Discharge status: Home / Self Care | Payer: Medicare HMO | Source: Ambulatory Visit | Attending: Radiation Oncology | Admitting: Radiation Oncology

## 2021-03-30 DIAGNOSIS — D0511 Intraductal carcinoma in situ of right breast: Secondary | ICD-10-CM | POA: Diagnosis not present

## 2021-03-30 DIAGNOSIS — Z853 Personal history of malignant neoplasm of breast: Secondary | ICD-10-CM | POA: Diagnosis not present

## 2021-03-30 DIAGNOSIS — Z510 Encounter for antineoplastic radiation therapy: Secondary | ICD-10-CM | POA: Diagnosis not present

## 2021-03-30 DIAGNOSIS — C50512 Malignant neoplasm of lower-outer quadrant of left female breast: Secondary | ICD-10-CM | POA: Diagnosis not present

## 2021-03-30 DIAGNOSIS — C50912 Malignant neoplasm of unspecified site of left female breast: Secondary | ICD-10-CM | POA: Insufficient documentation

## 2021-03-30 DIAGNOSIS — Z170 Estrogen receptor positive status [ER+]: Secondary | ICD-10-CM | POA: Insufficient documentation

## 2021-03-31 ENCOUNTER — Ambulatory Visit
Admission: RE | Admit: 2021-03-31 | Discharge: 2021-03-31 | Disposition: A | Discharge status: Home / Self Care | Payer: Medicare HMO | Source: Ambulatory Visit | Attending: Radiation Oncology | Admitting: Radiation Oncology

## 2021-03-31 DIAGNOSIS — C50912 Malignant neoplasm of unspecified site of left female breast: Secondary | ICD-10-CM | POA: Diagnosis not present

## 2021-03-31 DIAGNOSIS — C50512 Malignant neoplasm of lower-outer quadrant of left female breast: Secondary | ICD-10-CM | POA: Diagnosis not present

## 2021-03-31 DIAGNOSIS — Z853 Personal history of malignant neoplasm of breast: Secondary | ICD-10-CM | POA: Diagnosis not present

## 2021-03-31 DIAGNOSIS — Z510 Encounter for antineoplastic radiation therapy: Secondary | ICD-10-CM | POA: Diagnosis not present

## 2021-03-31 DIAGNOSIS — Z170 Estrogen receptor positive status [ER+]: Secondary | ICD-10-CM | POA: Diagnosis not present

## 2021-04-01 ENCOUNTER — Ambulatory Visit
Admission: RE | Admit: 2021-04-01 | Discharge: 2021-04-01 | Disposition: A | Discharge status: Home / Self Care | Payer: Medicare HMO | Source: Ambulatory Visit | Attending: Radiation Oncology | Admitting: Radiation Oncology

## 2021-04-01 DIAGNOSIS — Z853 Personal history of malignant neoplasm of breast: Secondary | ICD-10-CM | POA: Diagnosis not present

## 2021-04-01 DIAGNOSIS — C50912 Malignant neoplasm of unspecified site of left female breast: Secondary | ICD-10-CM | POA: Diagnosis not present

## 2021-04-01 DIAGNOSIS — Z170 Estrogen receptor positive status [ER+]: Secondary | ICD-10-CM | POA: Diagnosis not present

## 2021-04-01 DIAGNOSIS — Z510 Encounter for antineoplastic radiation therapy: Secondary | ICD-10-CM | POA: Diagnosis not present

## 2021-04-04 ENCOUNTER — Ambulatory Visit
Admission: RE | Admit: 2021-04-04 | Discharge: 2021-04-04 | Disposition: A | Discharge status: Home / Self Care | Payer: Medicare HMO | Source: Ambulatory Visit | Attending: Radiation Oncology | Admitting: Radiation Oncology

## 2021-04-04 DIAGNOSIS — Z510 Encounter for antineoplastic radiation therapy: Secondary | ICD-10-CM | POA: Diagnosis not present

## 2021-04-04 DIAGNOSIS — Z853 Personal history of malignant neoplasm of breast: Secondary | ICD-10-CM | POA: Diagnosis not present

## 2021-04-04 DIAGNOSIS — Z170 Estrogen receptor positive status [ER+]: Secondary | ICD-10-CM | POA: Diagnosis not present

## 2021-04-04 DIAGNOSIS — C50912 Malignant neoplasm of unspecified site of left female breast: Secondary | ICD-10-CM | POA: Diagnosis not present

## 2021-04-05 ENCOUNTER — Ambulatory Visit
Admission: RE | Admit: 2021-04-05 | Discharge: 2021-04-05 | Disposition: A | Discharge status: Home / Self Care | Payer: Medicare HMO | Source: Ambulatory Visit | Attending: Radiation Oncology | Admitting: Radiation Oncology

## 2021-04-05 ENCOUNTER — Other Ambulatory Visit: Payer: Self-pay | Admitting: *Deleted

## 2021-04-05 DIAGNOSIS — Z510 Encounter for antineoplastic radiation therapy: Secondary | ICD-10-CM | POA: Diagnosis not present

## 2021-04-05 DIAGNOSIS — Z170 Estrogen receptor positive status [ER+]: Secondary | ICD-10-CM | POA: Diagnosis not present

## 2021-04-05 DIAGNOSIS — Z853 Personal history of malignant neoplasm of breast: Secondary | ICD-10-CM | POA: Diagnosis not present

## 2021-04-05 DIAGNOSIS — C50912 Malignant neoplasm of unspecified site of left female breast: Secondary | ICD-10-CM | POA: Diagnosis not present

## 2021-04-05 MED ORDER — AZITHROMYCIN 250 MG PO TABS: ORAL_TABLET | ORAL | 0 refills | Status: DC

## 2021-04-06 ENCOUNTER — Ambulatory Visit
Admission: RE | Admit: 2021-04-06 | Discharge: 2021-04-06 | Disposition: A | Discharge status: Home / Self Care | Payer: Medicare HMO | Source: Ambulatory Visit | Attending: Radiation Oncology | Admitting: Radiation Oncology

## 2021-04-06 DIAGNOSIS — Z853 Personal history of malignant neoplasm of breast: Secondary | ICD-10-CM | POA: Diagnosis not present

## 2021-04-06 DIAGNOSIS — Z170 Estrogen receptor positive status [ER+]: Secondary | ICD-10-CM | POA: Diagnosis not present

## 2021-04-06 DIAGNOSIS — Z510 Encounter for antineoplastic radiation therapy: Secondary | ICD-10-CM | POA: Diagnosis not present

## 2021-04-06 DIAGNOSIS — C50912 Malignant neoplasm of unspecified site of left female breast: Secondary | ICD-10-CM | POA: Diagnosis not present

## 2021-04-06 DIAGNOSIS — C50512 Malignant neoplasm of lower-outer quadrant of left female breast: Secondary | ICD-10-CM | POA: Diagnosis not present

## 2021-04-07 ENCOUNTER — Ambulatory Visit
Admission: RE | Admit: 2021-04-07 | Discharge: 2021-04-07 | Disposition: A | Discharge status: Home / Self Care | Payer: Medicare HMO | Source: Ambulatory Visit | Attending: Radiation Oncology | Admitting: Radiation Oncology

## 2021-04-07 DIAGNOSIS — Z510 Encounter for antineoplastic radiation therapy: Secondary | ICD-10-CM | POA: Diagnosis not present

## 2021-04-07 DIAGNOSIS — C50512 Malignant neoplasm of lower-outer quadrant of left female breast: Secondary | ICD-10-CM | POA: Diagnosis not present

## 2021-04-07 DIAGNOSIS — Z170 Estrogen receptor positive status [ER+]: Secondary | ICD-10-CM | POA: Diagnosis not present

## 2021-04-07 DIAGNOSIS — C50912 Malignant neoplasm of unspecified site of left female breast: Secondary | ICD-10-CM | POA: Diagnosis not present

## 2021-04-07 DIAGNOSIS — Z853 Personal history of malignant neoplasm of breast: Secondary | ICD-10-CM | POA: Diagnosis not present

## 2021-04-08 ENCOUNTER — Ambulatory Visit
Admission: RE | Admit: 2021-04-08 | Discharge: 2021-04-08 | Disposition: A | Discharge status: Home / Self Care | Payer: Medicare HMO | Source: Ambulatory Visit | Attending: Radiation Oncology | Admitting: Radiation Oncology

## 2021-04-08 DIAGNOSIS — Z853 Personal history of malignant neoplasm of breast: Secondary | ICD-10-CM | POA: Diagnosis not present

## 2021-04-08 DIAGNOSIS — C50912 Malignant neoplasm of unspecified site of left female breast: Secondary | ICD-10-CM | POA: Diagnosis not present

## 2021-04-08 DIAGNOSIS — Z170 Estrogen receptor positive status [ER+]: Secondary | ICD-10-CM | POA: Diagnosis not present

## 2021-04-08 DIAGNOSIS — Z510 Encounter for antineoplastic radiation therapy: Secondary | ICD-10-CM | POA: Diagnosis not present

## 2021-04-11 ENCOUNTER — Ambulatory Visit
Admission: RE | Admit: 2021-04-11 | Discharge: 2021-04-11 | Disposition: A | Discharge status: Home / Self Care | Payer: Medicare HMO | Source: Ambulatory Visit | Attending: Radiation Oncology | Admitting: Radiation Oncology

## 2021-04-11 DIAGNOSIS — Z853 Personal history of malignant neoplasm of breast: Secondary | ICD-10-CM | POA: Diagnosis not present

## 2021-04-11 DIAGNOSIS — C50912 Malignant neoplasm of unspecified site of left female breast: Secondary | ICD-10-CM | POA: Diagnosis not present

## 2021-04-11 DIAGNOSIS — Z510 Encounter for antineoplastic radiation therapy: Secondary | ICD-10-CM | POA: Diagnosis not present

## 2021-04-11 DIAGNOSIS — Z170 Estrogen receptor positive status [ER+]: Secondary | ICD-10-CM | POA: Diagnosis not present

## 2021-04-11 NOTE — Progress Notes (Signed)
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  INTERVAL HISTORY: Patient returns to clinic today after the conclusion of her XRT for further evaluation and initiation of letrozole.  She tolerated treatments well only with some mild skin irritation.  She currently feels well and is asymptomatic.  She has no neurologic complaints.  She denies any recent fevers or illnesses.  She has a good appetite and denies weight loss.  She has no chest pain, shortness of breath, cough, or hemoptysis.  She denies any nausea, vomiting, constipation, or diarrhea.  She has no urinary complaints.  Patient offers no further specific complaints today.  REVIEW OF SYSTEMS:   Review of Systems  Constitutional: Negative.  Negative for fever and malaise/fatigue.  Respiratory: Negative.  Negative for cough, hemoptysis and shortness of breath.   Cardiovascular: Negative.  Negative for chest pain and leg swelling.  Gastrointestinal: Negative.  Negative for abdominal pain.  Genitourinary: Negative.  Negative for dysuria.  Musculoskeletal: Negative.  Negative for back pain.  Skin: Negative.  Negative for rash.  Neurological: Negative.  Negative for dizziness, sensory change, focal weakness, weakness and headaches.  Psychiatric/Behavioral: Negative.  The patient is not nervous/anxious.    As per HPI. Otherwise, a complete review of systems is negative.  PAST MEDICAL HISTORY: Past Medical  History:  Diagnosis Date   Breast cancer ([place]) 01/2019   right breast ca DCIS   COVID-19 09/2018   GERD (gastroesophageal reflux disease)    Hyperlipidemia    Hypertension    Hypothyroidism    Lumbar disc disease    MVP (mitral valve prolapse)    Personal history of radiation therapy    right breast ca. Finished 3/021   Pneumonia 09/2018   pt states she was positive for covid then as well    PAST SURGICAL HISTORY: Past Surgical History:  Procedure Laterality Date   ABDOMINAL HYSTERECTOMY     BREAST BIOPSY Right 01/08/2019   Affirm Biopsy- X-Clip- DCIS   BREAST BIOPSY Left 01/06/2021   stereo bx-asymmetry-"coil" clip-[facility]   BREAST LUMPECTOMY Right 02/13/2019   DCIS, negative LN,  close margins of 1 mm lateral   CARDIAC CATHETERIZATION N/A 06/02/2015   Procedure: Left Heart Cath and Coronary Angiography;  Surgeon: Teodoro Spray, MD;  Location: [facility];  Service: Cardiovascular;  Laterality: N/A;   COLONOSCOPY WITH PROPOFOL N/A 01/27/2019   Procedure: COLONOSCOPY WITH PROPOFOL;  Surgeon: Toledo, Benay Pike, MD;  Location: [facility];  Service: Gastroenterology;  Laterality: N/A;   ESOPHAGOGASTRODUODENOSCOPY (EGD) WITH PROPOFOL N/A 01/27/2019   Procedure: ESOPHAGOGASTRODUODENOSCOPY (EGD) WITH PROPOFOL;  Surgeon: Toledo, Benay Pike, MD;  Location: [facility];  Service: Gastroenterology;  Laterality: N/A;   PARTIAL MASTECTOMY WITH AXILLARY SENTINEL LYMPH NODE BIOPSY Right 02/13/2019   Procedure: PARTIAL MASTECTOMY WITH AXILLARY SENTINEL LYMPH NODE BIOPSY;  Surgeon: Benjamine Sprague, DO;  Location: [facility];  Service: General;  Laterality: Right;   PARTIAL MASTECTOMY WITH AXILLARY SENTINEL LYMPH NODE BIOPSY Left 02/02/2021   Procedure: PARTIAL MASTECTOMY WITH AXILLARY SENTINEL LYMPH NODE BIOPSY &  RF guided;  Surgeon: Benjamine Sprague, DO;  Location: [facility];  Service: General;  Laterality: Left;   STOMACH SURGERY     growth removed also removed gallbladder   THYROID SURGERY       FAMILY HISTORY: Family History  Problem Relation Age of Onset   Breast cancer Mother 69   Breast cancer Maternal Aunt    Breast cancer Maternal Aunt    Stomach cancer Other    Throat cancer Other     ADVANCED DIRECTIVES (Y/N):  N  HEALTH MAINTENANCE: Social History   Tobacco Use   Smoking status: Never   Smokeless tobacco: Never  Vaping Use   Vaping Use: Never used  Substance Use Topics   Alcohol use: Not Currently   Drug use: No     Colonoscopy:  PAP:  Bone density:  Lipid panel:  Allergies  Allergen Reactions   Ambien [Zolpidem] Other (See Comments)    erratic behavior/"made me crazy"   Latex Itching    redness   Paxil [Paroxetine] Other (See Comments)    Stomach pains.   Penicillins Other (See Comments)    Blisters in nose Did it involve swelling of the face/tongue/throat, SOB, or low BP? No Did it involve sudden or severe rash/hives, skin peeling, or any reaction on the inside of your mouth or nose? Yes--blisters in nose Did you need to seek medical attention at a hospital or doctor's office? No When did it last happen? More than 10 years ago    If all above answers are NO, may proceed with cephalosporin use.     Current Outpatient Medications  Medication Sig Dispense Refill   ALPRAZolam (XANAX) 0.25 MG tablet Take 0.25 mg by mouth daily as needed for anxiety.     amLODipine (NORVASC) 5 MG tablet Take 5 mg by mouth every morning.   3   azithromycin (ZITHROMAX Z-PAK) 250 MG tablet 1 Z-pack, follow package directions. 6 each 0   Biotin 1 MG CAPS Take 1 mg by mouth daily.     Cholecalciferol 50 MCG (2000 UT) TABS Take 2,000 Units by mouth daily.      ibuprofen (ADVIL) 400 MG tablet Take 1 tablet (400 mg total) by mouth every 8 (eight) hours as needed for mild pain or moderate pain. 30 tablet 0   letrozole (FEMARA) 2.5 MG tablet Take 1 tablet (2.5 mg total) by mouth daily. 90 tablet 3   omeprazole (PRILOSEC) 20 MG capsule Take 20 mg by mouth every  morning.   0   tamoxifen (NOLVADEX) 20 MG tablet TAKE 1 TABLET(20 MG) BY MOUTH DAILY 90 tablet 3   HYDROcodone-acetaminophen (NORCO) 5-325 MG tablet Take 1 tablet by mouth every 6 (six) hours as needed for up to 6 doses for moderate pain. (Patient not taking: Reported on 02/10/2021) 6 tablet 0   No current facility-administered medications for this visit.    OBJECTIVE: Vitals:   04/13/21 1127  BP: (!) 177/77  Pulse: 63  Temp: 97.9 F (36.6 C)     There is no height or weight on file to calculate BMI.    ECOG FS:0 - Asymptomatic  General: Well-developed, well-nourished, no acute distress. Eyes: Pink conjunctiva, anicteric sclera. HEENT: Normocephalic, moist mucous membranes. Breast: Left breast with some mild skin erythema. Lungs: No audible wheezing or coughing. Heart: Regular rate and rhythm. Abdomen: Soft, nontender, no obvious distention. Musculoskeletal: No edema, cyanosis, or clubbing. Neuro: Alert, answering all questions appropriately. Cranial nerves grossly intact. Skin: No rashes or petechiae  noted. Psych: Normal affect.   LAB RESULTS:  Lab Results  Component Value Date   NA 141 08/02/2020   K 3.9 08/02/2020   CL 103 08/02/2020   CO2 27 08/02/2020   GLUCOSE 117 (H) 08/02/2020   BUN 19 08/02/2020   CREATININE 0.87 08/02/2020   CALCIUM 9.2 08/02/2020   PROT 6.7 08/02/2020   ALBUMIN 4.0 08/02/2020   AST 24 08/02/2020   ALT 18 08/02/2020   ALKPHOS 47 08/02/2020   BILITOT 0.6 08/02/2020   GFRNONAA >60 08/02/2020   GFRAA >60 10/05/2018    Lab Results  Component Value Date   WBC 4.6 03/28/2021   NEUTROABS 2.9 05/19/2015   HGB 13.3 03/28/2021   HCT 39.3 03/28/2021   MCV 93.6 03/28/2021   PLT 246 03/28/2021     STUDIES: No results found.  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  PLAN:    Pathologic stage Ia ER/PR positive, HER2 negative invasive carcinoma of  the left breast: Despite being on tamoxifen for her right breast DCIS, patient developed invasive carcinoma in her left breast.  Patient underwent lumpectomy on February 02, 2021 confirming stage of disease.  Given the fact that the tumor size was a T1a, she does not require Oncotype testing or chemotherapy.  Patient completed adjuvant XRT on April 12, 2021.  She was given a prescription for letrozole today which she will take for a total of 5 years completing in February 2028.  Will get a baseline bone mineral density in the next 1 to 2 weeks.  Return to clinic in 3 months for routine evaluation.   DCIS, right breast: Patient underwent lumpectomy on February 13, 2019.  Although patient had close margins of 1 mm, it was determined that reexcision is not necessary.  She proceeded directly with adjuvant XRT completing in March 2021.  Discontinue tamoxifen as above. Hypertension: Patient's blood pressure is moderately elevated today. She reports she has left messages with her primary care for refills of her medication.  Continue monitoring and treatment per primary care.  Patient expressed understanding and was in agreement with this plan. She also understands that She can call clinic at any time with any questions, concerns, or complaints.    Cancer Staging  Ductal carcinoma in situ (DCIS) of right breast Staging form: Breast, AJCC 8th Edition - Clinical stage from 01/16/2019: Stage 0 (cTis (DCIS), cN0, cM0, ER+, PR+, HER2-) - Signed by Lloyd Huger, MD on 01/16/2019 Stage prefix: Initial diagnosis  Invasive ductal carcinoma of left breast [facility]) Staging form: Breast, AJCC 8th Edition - Pathologic stage from 02/10/2021: Stage IA (pT1a, pN0, cM0, G1, ER+, PR+, HER2-) - Signed by Lloyd Huger, MD on 02/10/2021 Stage prefix: Initial diagnosis Histologic grading system: 3 grade system   Lloyd Huger, MD   04/13/2021 1:48 PM

## 2021-04-12 ENCOUNTER — Ambulatory Visit
Admission: RE | Admit: 2021-04-12 | Discharge: 2021-04-12 | Disposition: A | Discharge status: Home / Self Care | Payer: Medicare HMO | Source: Ambulatory Visit | Attending: Radiation Oncology | Admitting: Radiation Oncology

## 2021-04-12 DIAGNOSIS — Z170 Estrogen receptor positive status [ER+]: Secondary | ICD-10-CM | POA: Diagnosis not present

## 2021-04-12 DIAGNOSIS — Z853 Personal history of malignant neoplasm of breast: Secondary | ICD-10-CM | POA: Diagnosis not present

## 2021-04-12 DIAGNOSIS — Z510 Encounter for antineoplastic radiation therapy: Secondary | ICD-10-CM | POA: Diagnosis not present

## 2021-04-12 DIAGNOSIS — C50912 Malignant neoplasm of unspecified site of left female breast: Secondary | ICD-10-CM | POA: Diagnosis not present

## 2021-04-12 DIAGNOSIS — C50512 Malignant neoplasm of lower-outer quadrant of left female breast: Secondary | ICD-10-CM | POA: Diagnosis not present

## 2021-04-13 ENCOUNTER — Encounter: Payer: Self-pay | Admitting: Oncology

## 2021-04-13 ENCOUNTER — Inpatient Hospital Stay: Payer: Medicare HMO | Attending: Oncology | Admitting: Oncology

## 2021-04-13 ENCOUNTER — Other Ambulatory Visit: Payer: Self-pay

## 2021-04-13 VITALS — BP 177/77 | HR 63 | Temp 97.9°F

## 2021-04-13 DIAGNOSIS — Z170 Estrogen receptor positive status [ER+]: Secondary | ICD-10-CM | POA: Diagnosis not present

## 2021-04-13 DIAGNOSIS — Z79899 Other long term (current) drug therapy: Secondary | ICD-10-CM | POA: Insufficient documentation

## 2021-04-13 DIAGNOSIS — C50911 Malignant neoplasm of unspecified site of right female breast: Secondary | ICD-10-CM | POA: Diagnosis not present

## 2021-04-13 DIAGNOSIS — E039 Hypothyroidism, unspecified: Secondary | ICD-10-CM | POA: Diagnosis not present

## 2021-04-13 DIAGNOSIS — C50912 Malignant neoplasm of unspecified site of left female breast: Secondary | ICD-10-CM | POA: Diagnosis not present

## 2021-04-13 DIAGNOSIS — Z79810 Long term (current) use of selective estrogen receptor modulators (SERMs): Secondary | ICD-10-CM | POA: Insufficient documentation

## 2021-04-13 MED ORDER — LETROZOLE 2.5 MG PO TABS: 2.5000 mg | ORAL_TABLET | Freq: Every day | ORAL | 3 refills | Status: DC

## 2021-04-13 NOTE — Progress Notes (Signed)
Pt has been having some dizzy spells, blood pressure problems and feeling out of it. She has taken 2 Zpks and its not any better.

## 2021-04-18 ENCOUNTER — Telehealth: Payer: Self-pay | Admitting: *Deleted

## 2021-04-18 NOTE — Telephone Encounter (Signed)
Patient needs a schedule bone density.

## 2021-05-09 ENCOUNTER — Ambulatory Visit
Admission: RE | Admit: 2021-05-09 | Discharge: 2021-05-09 | Disposition: A | Discharge status: Home / Self Care | Payer: Medicare HMO | Source: Ambulatory Visit | Attending: Oncology | Admitting: Oncology

## 2021-05-09 ENCOUNTER — Other Ambulatory Visit: Payer: Self-pay

## 2021-05-09 DIAGNOSIS — Z780 Asymptomatic menopausal state: Secondary | ICD-10-CM | POA: Insufficient documentation

## 2021-05-09 DIAGNOSIS — M85852 Other specified disorders of bone density and structure, left thigh: Secondary | ICD-10-CM | POA: Diagnosis not present

## 2021-05-09 DIAGNOSIS — Z923 Personal history of irradiation: Secondary | ICD-10-CM | POA: Diagnosis not present

## 2021-05-09 DIAGNOSIS — C50912 Malignant neoplasm of unspecified site of left female breast: Secondary | ICD-10-CM | POA: Insufficient documentation

## 2021-05-09 DIAGNOSIS — Z13820 Encounter for screening for osteoporosis: Secondary | ICD-10-CM | POA: Insufficient documentation

## 2021-05-09 DIAGNOSIS — Z853 Personal history of malignant neoplasm of breast: Secondary | ICD-10-CM | POA: Diagnosis not present

## 2021-05-10 DIAGNOSIS — E782 Mixed hyperlipidemia: Secondary | ICD-10-CM | POA: Diagnosis not present

## 2021-05-10 DIAGNOSIS — E538 Deficiency of other specified B group vitamins: Secondary | ICD-10-CM | POA: Diagnosis not present

## 2021-05-11 ENCOUNTER — Other Ambulatory Visit: Payer: Self-pay | Admitting: Oncology

## 2021-05-12 ENCOUNTER — Other Ambulatory Visit: Payer: Self-pay

## 2021-05-12 ENCOUNTER — Ambulatory Visit
Admission: RE | Admit: 2021-05-12 | Discharge: 2021-05-12 | Disposition: A | Discharge status: Home / Self Care | Payer: Medicare HMO | Source: Ambulatory Visit | Attending: Radiation Oncology | Admitting: Radiation Oncology

## 2021-05-12 ENCOUNTER — Encounter: Payer: Self-pay | Admitting: Radiation Oncology

## 2021-05-12 VITALS — BP 169/84 | HR 71 | Temp 96.7°F | Resp 18 | Ht 61.0 in | Wt 110.0 lb

## 2021-05-12 DIAGNOSIS — D0511 Intraductal carcinoma in situ of right breast: Secondary | ICD-10-CM | POA: Insufficient documentation

## 2021-05-12 DIAGNOSIS — Z79811 Long term (current) use of aromatase inhibitors: Secondary | ICD-10-CM | POA: Diagnosis not present

## 2021-05-12 DIAGNOSIS — C50512 Malignant neoplasm of lower-outer quadrant of left female breast: Secondary | ICD-10-CM | POA: Insufficient documentation

## 2021-05-12 DIAGNOSIS — Z170 Estrogen receptor positive status [ER+]: Secondary | ICD-10-CM | POA: Insufficient documentation

## 2021-05-12 DIAGNOSIS — Z923 Personal history of irradiation: Secondary | ICD-10-CM | POA: Insufficient documentation

## 2021-05-12 NOTE — Progress Notes (Signed)
Radiation Oncology ?Follow up Note ? ?Name: Chelsea Peters   ?Date:   05/12/2021 ?MRN:  [id number] ?DOB: 1940/06/19  ? ? ?This 81 y.o. female presents to the clinic today for 1 month follow-up status post whole breast radiation to her left breast for stage Ia ER/PR positive invasive mammary carcinoma. ? ?REFERRING PROVIDER: Rusty Aus, MD ? ?HPI: Patient is a 81 year old female now at 1 month having a pleated whole breast radiation to her left breast for ER/PR positive stage Ia invasive mammary carcinoma she is 2 years out from treatment to her right breast.  Seen today in routine follow-up she is doing well.  She specifically denies breast tenderness cough or bone pain.  She does have some unusual feelings in her breast related to her surgery and radiation..She is currently on letrozole time that well although she is having some hair loss. ? ?COMPLICATIONS OF TREATMENT: none ? ?FOLLOW UP COMPLIANCE: keeps appointments  ? ?PHYSICAL EXAM:  ?BP (!) 169/84   Pulse 71   Temp (!) 96.7 ?F (35.9 ?C)   Resp 18   Ht '5\' 1"'$  (1.549 m)   Wt 110 lb (49.9 kg)   BMI 20.78 kg/m?  ?Lungs are clear to A&P cardiac examination essentially unremarkable with regular rate and rhythm. No dominant mass or nodularity is noted in either breast in 2 positions examined. Incision is well-healed. No axillary or supraclavicular adenopathy is appreciated. Cosmetic result is excellent.  Well-developed well-nourished patient in NAD. HEENT reveals PERLA, EOMI, discs not visualized.  Oral cavity is clear. No oral mucosal lesions are identified. Neck is clear without evidence of cervical or supraclavicular adenopathy. Lungs are clear to A&P. Cardiac examination is essentially unremarkable with regular rate and rhythm without murmur rub or thrill. Abdomen is benign with no organomegaly or masses noted. Motor sensory and DTR levels are equal and symmetric in the upper and lower extremities. Cranial nerves II through XII are grossly intact.  Proprioception is intact. No peripheral adenopathy or edema is identified. No motor or sensory levels are noted. Crude visual fields are within normal range. ? ?RADIOLOGY RESULTS: No current films to review ? ?PLAN: Present time she is now at 1 month radiation therapy treatments to her left breast in 2 years to her right breast she is doing well really no significant side effects or complaints.  She continues on letrozole without side effect.  I have asked to see her back in 4 to 5 months for follow-up.  Patient is to call with any concerns. ? ?I would like to take this opportunity to thank you for allowing me to participate in the care of your patient.. ?  ? Noreene Filbert, MD ? ?

## 2021-05-17 DIAGNOSIS — F32A Depression, unspecified: Secondary | ICD-10-CM | POA: Diagnosis not present

## 2021-05-17 DIAGNOSIS — C50912 Malignant neoplasm of unspecified site of left female breast: Secondary | ICD-10-CM | POA: Diagnosis not present

## 2021-05-17 DIAGNOSIS — M5431 Sciatica, right side: Secondary | ICD-10-CM | POA: Diagnosis not present

## 2021-05-17 DIAGNOSIS — M5459 Other low back pain: Secondary | ICD-10-CM | POA: Diagnosis not present

## 2021-05-17 DIAGNOSIS — Z0000 Encounter for general adult medical examination without abnormal findings: Secondary | ICD-10-CM | POA: Diagnosis not present

## 2021-05-20 ENCOUNTER — Telehealth: Payer: Self-pay | Admitting: *Deleted

## 2021-05-20 ENCOUNTER — Encounter: Payer: Self-pay | Admitting: *Deleted

## 2021-05-20 NOTE — Telephone Encounter (Signed)
I attempted to call patient but her phone just kept ringing and never went to voice mail or answered. I sent patient a My Chart message regarding doctor recommendations ?

## 2021-05-20 NOTE — Telephone Encounter (Addendum)
Patient called reporting that she is having hot flashes mostly in her face which turns very red. She is asking what she can do about them. Please adivse ?

## 2021-05-25 ENCOUNTER — Ambulatory Visit: Payer: Medicare HMO | Admitting: Radiation Oncology

## 2021-06-02 ENCOUNTER — Inpatient Hospital Stay: Payer: Medicare HMO | Attending: Internal Medicine | Admitting: Medical Oncology

## 2021-06-02 ENCOUNTER — Encounter: Payer: Self-pay | Admitting: Medical Oncology

## 2021-06-02 VITALS — BP 155/75 | HR 60 | Temp 98.3°F | Resp 18 | Wt 107.0 lb

## 2021-06-02 DIAGNOSIS — Z170 Estrogen receptor positive status [ER+]: Secondary | ICD-10-CM | POA: Diagnosis not present

## 2021-06-02 DIAGNOSIS — C50912 Malignant neoplasm of unspecified site of left female breast: Secondary | ICD-10-CM | POA: Diagnosis not present

## 2021-06-02 DIAGNOSIS — D0512 Intraductal carcinoma in situ of left breast: Secondary | ICD-10-CM | POA: Diagnosis not present

## 2021-06-02 DIAGNOSIS — Z79811 Long term (current) use of aromatase inhibitors: Secondary | ICD-10-CM | POA: Diagnosis not present

## 2021-06-02 DIAGNOSIS — Z923 Personal history of irradiation: Secondary | ICD-10-CM | POA: Insufficient documentation

## 2021-06-02 DIAGNOSIS — D0511 Intraductal carcinoma in situ of right breast: Secondary | ICD-10-CM | POA: Insufficient documentation

## 2021-06-02 DIAGNOSIS — R232 Flushing: Secondary | ICD-10-CM | POA: Diagnosis not present

## 2021-06-02 DIAGNOSIS — T50905A Adverse effect of unspecified drugs, medicaments and biological substances, initial encounter: Secondary | ICD-10-CM | POA: Diagnosis not present

## 2021-06-02 DIAGNOSIS — C50911 Malignant neoplasm of unspecified site of right female breast: Secondary | ICD-10-CM | POA: Insufficient documentation

## 2021-06-02 MED ORDER — ANASTROZOLE 1 MG PO TABS: 1.0000 mg | ORAL_TABLET | Freq: Every day | ORAL | 5 refills | Status: DC

## 2021-06-02 NOTE — Progress Notes (Signed)
?[facility]  ?Telephone:(336) [phone] [phone]) [phone] ? ?ID: Chelsea Peters OB: 1941/02/11  MR#: [id number]  PYK#:[id number] ? ?Patient Care Team: ?Rusty Aus, MD as PCP - General (Internal Medicine) ?Benjamine Sprague, DO as Consulting Physician (Surgery) ?Rico Junker, RN as Oncology Nurse Navigator ?Lloyd Huger, MD as Consulting Physician (Oncology) ?Noreene Filbert, MD as Referring Physician (Radiation Oncology) ? 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 ? ?INTERVAL HISTORY: Patient returns to clinic today for discussion of her hot flashes that she has had as a side effect of her Letrozole. She reports hot flashes of her face and some scattered inconsistent bone pains since starting therapy after her radiation treating in Feb 2023. She has been off of the medication for about 2 weeks and noticed improvement in her symptoms while off of this medication. She wants to know if the medication can be changed as these symptoms are bothersome and affecting her quality of life.  ? ?REVIEW OF SYSTEMS:   ?Review of Systems  ?Constitutional: Negative.  Negative for fever and malaise/fatigue.  ?Respiratory: Negative.  Negative for cough, hemoptysis and shortness of breath.   ?Cardiovascular: Negative.  Negative for chest pain and leg swelling.  ?Gastrointestinal: Negative.  Negative for abdominal pain.  ?Genitourinary: Negative.  Negative for dysuria.  ?Musculoskeletal: Negative.  Negative for back pain.  ?Skin: Negative.  Negative for rash.  ?Neurological: Negative.  Negative for dizziness, sensory change, focal weakness, weakness and headaches.  ?Psychiatric/Behavioral: Negative.  The patient is not nervous/anxious.   ? ?As per HPI. Otherwise, a complete review of systems is negative. ? ?PAST MEDICAL HISTORY: ?Past Medical History:  ?Diagnosis Date  ? Breast cancer ([employer]) 01/2019  ? right  breast ca DCIS  ? COVID-19 09/2018  ? GERD (gastroesophageal reflux disease)   ? Hyperlipidemia   ? Hypertension   ? Hypothyroidism   ? Lumbar disc disease   ? MVP (mitral valve prolapse)   ? Personal history of radiation therapy   ? right breast ca. Finished 3/021  ? Pneumonia 09/2018  ? pt states she was positive for covid then as well  ? ? ?PAST SURGICAL HISTORY: ?Past Surgical History:  ?Procedure Laterality Date  ? ABDOMINAL HYSTERECTOMY    ? BREAST BIOPSY Right 01/08/2019  ? Affirm Biopsy- X-Clip- DCIS  ? BREAST BIOPSY Left 01/06/2021  ? stereo bx-asymmetry-"coil" clip-[facility]  ? BREAST LUMPECTOMY Right 02/13/2019  ? DCIS, negative LN,  close margins of 1 mm lateral  ? CARDIAC CATHETERIZATION N/A 06/02/2015  ? Procedure: Left Heart Cath and Coronary Angiography;  Surgeon: Teodoro Spray, MD;  Location: [facility];  Service: Cardiovascular;  Laterality: N/A;  ? COLONOSCOPY WITH PROPOFOL N/A 01/27/2019  ? Procedure: COLONOSCOPY WITH PROPOFOL;  Surgeon: Toledo, Benay Pike, MD;  Location: [facility];  Service: Gastroenterology;  Laterality: N/A;  ? ESOPHAGOGASTRODUODENOSCOPY (EGD) WITH PROPOFOL N/A 01/27/2019  ? Procedure: ESOPHAGOGASTRODUODENOSCOPY (EGD) WITH PROPOFOL;  Surgeon: Toledo, Benay Pike, MD;  Location: [facility];  Service: Gastroenterology;  Laterality: N/A;  ? PARTIAL MASTECTOMY WITH AXILLARY SENTINEL LYMPH NODE BIOPSY Right 02/13/2019  ? Procedure: PARTIAL MASTECTOMY WITH AXILLARY SENTINEL LYMPH NODE BIOPSY;  Surgeon: Benjamine Sprague, DO;  Location: [facility];  Service: General;  Laterality: Right;  ? PARTIAL MASTECTOMY WITH AXILLARY SENTINEL LYMPH NODE BIOPSY Left 02/02/2021  ? Procedure: PARTIAL MASTECTOMY WITH AXILLARY SENTINEL LYMPH NODE BIOPSY & RF guided;  Surgeon: Benjamine Sprague, DO;  Location: [facility];  Service: General;  Laterality: Left;  ? STOMACH SURGERY    ? growth removed also removed gallbladder  ? THYROID SURGERY    ? ? ?FAMILY HISTORY: ?Family History  ?Problem Relation Age of  Onset  ? Breast cancer Mother 45  ? Breast cancer Maternal Aunt   ? Breast cancer Maternal Aunt   ? Stomach cancer Other   ? Throat cancer Other   ? ? ?ADVANCED DIRECTIVES (Y/N):  N ? ?HEALTH MAINTENANCE: ?Social History  ? ?Tobacco Use  ? Smoking status: Never  ? Smokeless tobacco: Never  ?Vaping Use  ? Vaping Use: Never used  ?Substance Use Topics  ? Alcohol use: Not Currently  ? Drug use: No  ? ? ? Colonoscopy: ? PAP: ? Bone density: ? Lipid panel: ? ?Allergies  ?Allergen Reactions  ? Ambien [Zolpidem] Other (See Comments)  ?  erratic behavior/"made me crazy"  ? Latex Itching  ?  redness  ? Paxil [Paroxetine] Other (See Comments)  ?  Stomach pains.  ? Penicillins Other (See Comments)  ?  Blisters in nose ?Did it involve swelling of the face/tongue/throat, SOB, or low BP? No ?Did it involve sudden or severe rash/hives, skin peeling, or any reaction on the inside of your mouth or nose? Yes--blisters in nose ?Did you need to seek medical attention at a hospital or doctor's office? No ?When did it last happen? More than 10 years ago    ?If all above answers are ?NO?, may proceed with cephalosporin use. ?  ? ? ?Current Outpatient Medications  ?Medication Sig Dispense Refill  ? ALPRAZolam (XANAX) 0.25 MG tablet Take 0.25 mg by mouth daily as needed for anxiety.    ? anastrozole (ARIMIDEX) 1 MG tablet Take 1 tablet (1 mg total) by mouth daily. 30 tablet 5  ? Biotin 1 MG CAPS Take 1 mg by mouth daily.    ? Cholecalciferol 50 MCG (2000 UT) TABS Take 2,000 Units by mouth daily.     ? ibuprofen (ADVIL) 400 MG tablet Take 1 tablet (400 mg total) by mouth every 8 (eight) hours as needed for mild pain or moderate pain. 30 tablet 0  ? meclizine (ANTIVERT) 25 MG tablet Take by mouth.    ? olmesartan-hydrochlorothiazide (BENICAR HCT) 20-12.5 MG tablet Take 1 tablet by mouth daily.    ? omeprazole (PRILOSEC) 20 MG capsule Take 20 mg by mouth every morning.   0  ? solifenacin (VESICARE) 5 MG tablet Take 5 mg by mouth daily.    ?  amLODipine (NORVASC) 5 MG tablet Take 5 mg by mouth every morning.  (Patient not taking: Reported on 06/02/2021)  3  ? azithromycin (ZITHROMAX Z-PAK) 250 MG tablet 1 Z-pack, follow package directions. (Patient not taking: Reported on 05/12/2021) 6 each 0  ? HYDROcodone-acetaminophen (NORCO) 5-325 MG tablet Take 1 tablet by mouth every 6 (six) hours as needed for up to 6 doses for moderate pain. (Patient not taking: Reported on 02/10/2021) 6 tablet 0  ? ?No current facility-administered medications for this visit.  ? ? ?OBJECTIVE: ?Vitals:  ? 06/02/21 1000  ?BP: (!) 155/75  ?Pulse: 60  ?Resp: 18  ?Temp: 98.3 ?F (36.8 ?C)  ?   Body mass index is 20.22 kg/m?Marland Kitchen    ECOG FS:0 - Asymptomatic ? ?General: Well-developed, well-nourished, no acute distress. ?Eyes: Pink conjunctiva, anicteric sclera. ?HEENT: Normocephalic, moist mucous membranes. ?Lungs: No audible wheezing or coughing. ?Heart: Regular rate and rhythm. ?Musculoskeletal: No edema, cyanosis, or clubbing. ?Neuro:  Alert, answering all questions appropriately. Cranial nerves grossly intact. ?Skin: No rashes or petechiae noted. ?Psych: Normal affect. ? ? ?LAB RESULTS: ? ?Lab Results  ?Component Value Date  ? NA 141 08/02/2020  ? K 3.9 08/02/2020  ? CL 103 08/02/2020  ? CO2 27 08/02/2020  ? GLUCOSE 117 (H) 08/02/2020  ? BUN 19 08/02/2020  ? CREATININE 0.87 08/02/2020  ? CALCIUM 9.2 08/02/2020  ? PROT 6.7 08/02/2020  ? ALBUMIN 4.0 08/02/2020  ? AST 24 08/02/2020  ? ALT 18 08/02/2020  ? ALKPHOS 47 08/02/2020  ? BILITOT 0.6 08/02/2020  ? GFRNONAA >60 08/02/2020  ? GFRAA >60 10/05/2018  ? ? ?Lab Results  ?Component Value Date  ? WBC 4.6 03/28/2021  ? NEUTROABS 2.9 05/19/2015  ? HGB 13.3 03/28/2021  ? HCT 39.3 03/28/2021  ? MCV 93.6 03/28/2021  ? PLT 246 03/28/2021  ? ? ? ?STUDIES: ?DG Bone Density ? ?Result Date: 05/09/2021 ?EXAM: DUAL X-RAY ABSORPTIOMETRY (DXA) FOR BONE MINERAL DENSITY IMPRESSION: Your patient Chelsea Peters completed a BMD test on 05/09/2021 using the [employer] (software version: 14.10) manufactured by [employer]. The following summarizes the results of our evaluation. Technologist: ECJ PATIENT BIOGRAPHICAL: Name: Isel, Skufca

## 2021-06-02 NOTE — Progress Notes (Signed)
Patient was having hot flashes specifically in her face turning red with a burning sensation while taking Letrozole.  She has stopped Letrozole for a few weeks and the hot lashes are not as severe, more tolerable.   ?

## 2021-06-17 DIAGNOSIS — M9901 Segmental and somatic dysfunction of cervical region: Secondary | ICD-10-CM | POA: Diagnosis not present

## 2021-06-17 DIAGNOSIS — M9902 Segmental and somatic dysfunction of thoracic region: Secondary | ICD-10-CM | POA: Diagnosis not present

## 2021-06-17 DIAGNOSIS — M9906 Segmental and somatic dysfunction of lower extremity: Secondary | ICD-10-CM | POA: Diagnosis not present

## 2021-06-17 DIAGNOSIS — M9903 Segmental and somatic dysfunction of lumbar region: Secondary | ICD-10-CM | POA: Diagnosis not present

## 2021-06-20 DIAGNOSIS — M9903 Segmental and somatic dysfunction of lumbar region: Secondary | ICD-10-CM | POA: Diagnosis not present

## 2021-06-20 DIAGNOSIS — M9906 Segmental and somatic dysfunction of lower extremity: Secondary | ICD-10-CM | POA: Diagnosis not present

## 2021-06-20 DIAGNOSIS — M9902 Segmental and somatic dysfunction of thoracic region: Secondary | ICD-10-CM | POA: Diagnosis not present

## 2021-06-20 DIAGNOSIS — M9901 Segmental and somatic dysfunction of cervical region: Secondary | ICD-10-CM | POA: Diagnosis not present

## 2021-06-21 ENCOUNTER — Other Ambulatory Visit: Payer: Self-pay

## 2021-06-21 ENCOUNTER — Emergency Department
Admission: EM | Admit: 2021-06-21 | Discharge: 2021-06-21 | Disposition: A | Discharge status: Home / Self Care | Payer: Medicare HMO | Attending: Student in an Organized Health Care Education/Training Program | Admitting: Student in an Organized Health Care Education/Training Program

## 2021-06-21 ENCOUNTER — Emergency Department: Payer: Medicare HMO

## 2021-06-21 DIAGNOSIS — I2510 Atherosclerotic heart disease of native coronary artery without angina pectoris: Secondary | ICD-10-CM | POA: Diagnosis not present

## 2021-06-21 DIAGNOSIS — M549 Dorsalgia, unspecified: Secondary | ICD-10-CM | POA: Diagnosis not present

## 2021-06-21 DIAGNOSIS — R079 Chest pain, unspecified: Secondary | ICD-10-CM | POA: Insufficient documentation

## 2021-06-21 DIAGNOSIS — R42 Dizziness and giddiness: Secondary | ICD-10-CM | POA: Insufficient documentation

## 2021-06-21 DIAGNOSIS — I2699 Other pulmonary embolism without acute cor pulmonale: Secondary | ICD-10-CM | POA: Diagnosis not present

## 2021-06-21 DIAGNOSIS — R0602 Shortness of breath: Secondary | ICD-10-CM | POA: Diagnosis not present

## 2021-06-21 DIAGNOSIS — R918 Other nonspecific abnormal finding of lung field: Secondary | ICD-10-CM | POA: Diagnosis not present

## 2021-06-21 DIAGNOSIS — R0789 Other chest pain: Secondary | ICD-10-CM | POA: Diagnosis not present

## 2021-06-21 DIAGNOSIS — I700 Atherosclerosis of aorta: Secondary | ICD-10-CM | POA: Diagnosis not present

## 2021-06-21 LAB — HEPATIC FUNCTION PANEL
ALT: 20 U/L (ref 0–44)
AST: 22 U/L (ref 15–41)
Albumin: 4 g/dL (ref 3.5–5.0)
Alkaline Phosphatase: 48 U/L (ref 38–126)
Bilirubin, Direct: <0.1 mg/dL (ref 0.0–0.2)
Total Bilirubin: 0.8 mg/dL (ref 0.3–1.2)
Total Protein: 6.8 g/dL (ref 6.5–8.1)

## 2021-06-21 LAB — CBC
HCT: 39 % (ref 36.0–46.0)
Hemoglobin: 13 g/dL (ref 12.0–15.0)
MCH: 30.9 pg (ref 26.0–34.0)
MCHC: 33.3 g/dL (ref 30.0–36.0)
MCV: 92.6 fL (ref 80.0–100.0)
Platelets: 231 10*3/uL (ref 150–400)
RBC: 4.21 MIL/uL (ref 3.87–5.11)
RDW: 12.4 % (ref 11.5–15.5)
WBC: 4 10*3/uL (ref 4.0–10.5)
nRBC: 0 % (ref 0.0–0.2)

## 2021-06-21 LAB — TROPONIN I (HIGH SENSITIVITY)
Troponin I (High Sensitivity): 4 ng/L (ref <18)
Troponin I (High Sensitivity): 5 ng/L (ref <18)

## 2021-06-21 LAB — BASIC METABOLIC PANEL
Anion gap: 9 (ref 5–15)
BUN: 21 mg/dL (ref 8–23)
CO2: 28 mmol/L (ref 22–32)
Calcium: 9.7 mg/dL (ref 8.9–10.3)
Chloride: 101 mmol/L (ref 98–111)
Creatinine, Ser: 1.06 mg/dL — ABNORMAL HIGH (ref 0.44–1.00)
GFR, Estimated: 54 mL/min — ABNORMAL LOW (ref >60)
Glucose, Bld: 112 mg/dL — ABNORMAL HIGH (ref 70–99)
Potassium: 4 mmol/L (ref 3.5–5.1)
Sodium: 138 mmol/L (ref 135–145)

## 2021-06-21 LAB — LIPASE, BLOOD: Lipase: 26 U/L (ref 11–51)

## 2021-06-21 MED ORDER — IOHEXOL 350 MG/ML SOLN
75.0000 mL | Freq: Once | INTRAVENOUS | Status: AC | PRN
  Administered 2021-06-21: 75 mL via INTRAVENOUS
  Filled 2021-06-21: qty 75

## 2021-06-21 MED ORDER — ONDANSETRON HCL 4 MG/2ML IJ SOLN
4.0000 mg | Freq: Once | INTRAMUSCULAR | Status: AC
  Administered 2021-06-21: 4 mg via INTRAVENOUS
  Filled 2021-06-21: qty 2

## 2021-06-21 MED ORDER — MORPHINE SULFATE (PF) 4 MG/ML IV SOLN
4.0000 mg | INTRAVENOUS | Status: DC | PRN
Start: 2021-06-21 — End: 2021-06-21
  Administered 2021-06-21: 4 mg via INTRAVENOUS
  Filled 2021-06-21: qty 1

## 2021-06-21 MED ORDER — HYDROCODONE-ACETAMINOPHEN 5-325 MG PO TABS: 1.0000 | ORAL_TABLET | ORAL | 0 refills | Status: DC | PRN

## 2021-06-21 MED ORDER — SODIUM CHLORIDE 0.9 % IV BOLUS
500.0000 mL | Freq: Once | INTRAVENOUS | Status: AC
Start: 2021-06-21 — End: 2021-06-21
  Administered 2021-06-21: 500 mL via INTRAVENOUS

## 2021-06-21 NOTE — ED Provider Notes (Signed)
----------------------------------------- ?  5:36 PM on 06/21/2021 ?----------------------------------------- ?Patient care assumed from Dr. Quentin Cornwall.  Patient's work-up is overall reassuring.  Troponin negative x2, LFTs are normal as well.  I reviewed the patient's CT scan of the chest which shows no concerning findings.  Chemistry and CBC are normal as well.  Patient states a history of back issues states at times she will have pain shooting down her legs.  Patient follows up with a chiropractor.  I discussed with the patient follow-up with her doctor and I will also refer to orthopedics.  Patient agreeable to plan of care.  We will discharge with short course of pain medication.  Patient states she has taken hydrocodone in the past without ill effect. ?  ?Harvest Dark, MD ?06/21/21 1736 ? ?

## 2021-06-21 NOTE — ED Notes (Signed)
See triage note  presents with pain to upper back,between shoulder blades   states pain started several days ago   pain increases with movement and inspiration  denies any fever    states she did have an adjustment last Thursday  thinks the pain became worse ?

## 2021-06-21 NOTE — ED Provider Notes (Signed)
? ?[facility] ?Provider Note ? ? ? Event Date/Time  ? First MD Initiated Contact with Patient 06/21/21 1354   ?  (approximate) ? ? ?History  ? ?Back Pain, Shortness of Breath, and Dizziness ? ? ?HPI ? ?Chelsea Peters is a 81 y.o. female history of breast cancer and is completing round of chemo and radiation presents to the ER for evaluation of chest pain as well as some shortness of breath the past few days.  Has been feeling lightheaded past few days and today felt she was about to pass out.  States the pain does go down her arm.  Does have some discomfort with deep inspiration has been feeling short of breath does not wear oxygen no measured fevers or chills. ?  ? ? ?Physical Exam  ? ?Triage Vital Signs: ?ED Triage Vitals  ?Enc Vitals Group  ?   BP 06/21/21 1331 (!) 162/78  ?   Pulse Rate 06/21/21 1331 77  ?   Resp 06/21/21 1331 16  ?   Temp 06/21/21 1331 97.8 ?F (36.6 ?C)  ?   Temp Source 06/21/21 1331 Oral  ?   SpO2 06/21/21 1331 100 %  ?   Weight 06/21/21 1401 106 lb 14.8 oz (48.5 kg)  ?   Height 06/21/21 1401 '5\' 1"'$  (1.549 m)  ?   Head Circumference --   ?   Peak Flow --   ?   Pain Score --   ?   Pain Loc --   ?   Pain Edu? --   ?   Excl. in [place]? --   ? ? ?Most recent vital signs: ?Vitals:  ? 06/21/21 1331  ?BP: (!) 162/78  ?Pulse: 77  ?Resp: 16  ?Temp: 97.8 ?F (36.6 ?C)  ?SpO2: 100%  ? ? ? ?Constitutional: Alert  ?Eyes: Conjunctivae are normal.  ?Head: Atraumatic. ?Nose: No congestion/rhinnorhea. ?Mouth/Throat: Mucous membranes are moist.   ?Neck: Painless ROM.  ?Cardiovascular:   Good peripheral circulation. No m/g/r ?Respiratory: Normal respiratory effort.  No retractions. No crackles or wheezing ?Gastrointestinal: Soft and nontender in all four quadrants ?Musculoskeletal:  no deformity ?Neurologic:  MAE spontaneously. No gross focal neurologic deficits are appreciated.  ?Skin:  Skin is warm, dry and intact. No rash noted. ?Psychiatric: Mood and affect are normal. Speech and behavior  are normal. ? ? ? ?ED Results / Procedures / Treatments  ? ?Labs ?(all labs ordered are listed, but only abnormal results are displayed) ?Labs Reviewed  ?BASIC METABOLIC PANEL - Abnormal; Notable for the following components:  ?    Result Value  ? Glucose, Bld 112 (*)   ? Creatinine, Ser 1.06 (*)   ? GFR, Estimated 54 (*)   ? All other components within normal limits  ?CBC  ?URINALYSIS, ROUTINE W REFLEX MICROSCOPIC  ?HEPATIC FUNCTION PANEL  ?LIPASE, BLOOD  ?CBG MONITORING, ED  ?TROPONIN I (HIGH SENSITIVITY)  ?TROPONIN I (HIGH SENSITIVITY)  ? ? ? ?EKG ? ?ED ECG REPORT ?I, Merlyn Lot, the attending physician, personally viewed and interpreted this ECG. ? ? Date: 06/21/2021 ? EKG Time: 13:29 ? Rate: chest pain ? Rhythm: sinus ? Axis: normal ? Intervals:normal intervals ? ST&T Change: no stemi, nonspecific st abn ? ? ? ?RADIOLOGY ?Please see ED Course for my review and interpretation. ? ?I personally reviewed all radiographic images ordered to evaluate for the above acute complaints and reviewed radiology reports and findings.  These findings were personally discussed with the patient.  Please see  medical record for radiology report. ? ? ? ?PROCEDURES: ? ?Critical Care performed:  ? ?Procedures ? ? ?MEDICATIONS ORDERED IN ED: ?Medications  ?morphine (PF) 4 MG/ML injection 4 mg (4 mg Intravenous Given 06/21/21 1430)  ?sodium chloride 0.9 % bolus 500 mL (500 mLs Intravenous New Bag/Given 06/21/21 1424)  ?ondansetron (ZOFRAN) injection 4 mg (4 mg Intravenous Given 06/21/21 1428)  ?iohexol (OMNIPAQUE) 350 MG/ML injection 75 mL (75 mLs Intravenous Contrast Given 06/21/21 1438)  ? ? ? ?IMPRESSION / MDM / ASSESSMENT AND PLAN / ED COURSE  ?I reviewed the triage vital signs and the nursing notes. ?             ?               ? ?Differential diagnosis includes, but is not limited to, ACS, pericarditis, esophagitis, boerhaaves, pe, dissection, pna, bronchitis, costochondritis ? ?Patient presented to ER with symptoms as  described above.  Clinically nontoxic-appearing afebrile hemodynamically stable no respiratory distress or hypoxia.  EKG with some nonspecific changes.  Given her age and risk factors will order serial enzymes and observe on telemetry.  Given her breast cancer with some shortness of breath associated with chest pain will order CTA she is high risk profile for DVT.  CTA without evidence of pe or acute process.  Patient signed out to oncoming physician awaiting repeat trop and blood work.   ? ? ?  ? ? ?FINAL CLINICAL IMPRESSION(S) / ED DIAGNOSES  ? ?Final diagnoses:  ?Chest pain, unspecified type  ? ? ? ?Rx / DC Orders  ? ?ED Discharge Orders   ? ? None  ? ?  ? ? ? ?Note:  This document was prepared using Dragon voice recognition software and may include unintentional dictation errors. ? ?  ?Merlyn Lot, MD ?06/21/21 1544 ? ?

## 2021-06-21 NOTE — ED Triage Notes (Signed)
Pt states she has been having pain in her back between her shoulders and had been to the chiropractor a couple of times, today having dizziness and feeling like she is going to pass out, pt is ambulatory with a steady gait ?

## 2021-06-27 DIAGNOSIS — M545 Low back pain, unspecified: Secondary | ICD-10-CM | POA: Diagnosis not present

## 2021-06-27 DIAGNOSIS — M5416 Radiculopathy, lumbar region: Secondary | ICD-10-CM | POA: Diagnosis not present

## 2021-07-08 ENCOUNTER — Telehealth: Payer: Self-pay | Admitting: Oncology

## 2021-07-08 NOTE — Telephone Encounter (Signed)
pt called in wanting to r/s appt, have a few things going on right now and cannot make appt.Cherylann Banas  ?

## 2021-07-11 DIAGNOSIS — M9902 Segmental and somatic dysfunction of thoracic region: Secondary | ICD-10-CM | POA: Diagnosis not present

## 2021-07-11 DIAGNOSIS — M9903 Segmental and somatic dysfunction of lumbar region: Secondary | ICD-10-CM | POA: Diagnosis not present

## 2021-07-11 DIAGNOSIS — M9901 Segmental and somatic dysfunction of cervical region: Secondary | ICD-10-CM | POA: Diagnosis not present

## 2021-07-11 DIAGNOSIS — M9906 Segmental and somatic dysfunction of lower extremity: Secondary | ICD-10-CM | POA: Diagnosis not present

## 2021-07-13 ENCOUNTER — Inpatient Hospital Stay: Payer: Medicare HMO | Admitting: Oncology

## 2021-07-13 DIAGNOSIS — M9903 Segmental and somatic dysfunction of lumbar region: Secondary | ICD-10-CM | POA: Diagnosis not present

## 2021-07-13 DIAGNOSIS — M9906 Segmental and somatic dysfunction of lower extremity: Secondary | ICD-10-CM | POA: Diagnosis not present

## 2021-07-13 DIAGNOSIS — M9902 Segmental and somatic dysfunction of thoracic region: Secondary | ICD-10-CM | POA: Diagnosis not present

## 2021-07-13 DIAGNOSIS — M9901 Segmental and somatic dysfunction of cervical region: Secondary | ICD-10-CM | POA: Diagnosis not present

## 2021-07-15 DIAGNOSIS — M9902 Segmental and somatic dysfunction of thoracic region: Secondary | ICD-10-CM | POA: Diagnosis not present

## 2021-07-15 DIAGNOSIS — M9903 Segmental and somatic dysfunction of lumbar region: Secondary | ICD-10-CM | POA: Diagnosis not present

## 2021-07-15 DIAGNOSIS — M9901 Segmental and somatic dysfunction of cervical region: Secondary | ICD-10-CM | POA: Diagnosis not present

## 2021-07-15 DIAGNOSIS — M9906 Segmental and somatic dysfunction of lower extremity: Secondary | ICD-10-CM | POA: Diagnosis not present

## 2021-07-19 DIAGNOSIS — M5412 Radiculopathy, cervical region: Secondary | ICD-10-CM | POA: Diagnosis not present

## 2021-07-19 DIAGNOSIS — M542 Cervicalgia: Secondary | ICD-10-CM | POA: Diagnosis not present

## 2021-07-19 DIAGNOSIS — C50919 Malignant neoplasm of unspecified site of unspecified female breast: Secondary | ICD-10-CM | POA: Diagnosis not present

## 2021-08-01 NOTE — Progress Notes (Unsigned)
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  INTERVAL HISTORY: Patient returns to clinic today for routine 92-monthevaluation.  She is tolerating anastrozole well without significant side effects.  She has bilateral shoulder pain and decreased endurance, but otherwise feels well.  She has no neurologic complaints.  She denies any recent fevers or illnesses.  She has a good appetite and denies weight loss.  She has no chest pain, shortness of breath, cough, or hemoptysis.  She denies any nausea, vomiting, constipation, or diarrhea.  She has no urinary complaints.  Patient offers no further specific complaints today.  REVIEW OF SYSTEMS:   Review of Systems  Constitutional: Negative.  Negative for fever and malaise/fatigue.  Respiratory: Negative.  Negative for cough, hemoptysis and shortness of breath.   Cardiovascular: Negative.  Negative for chest pain and leg swelling.  Gastrointestinal: Negative.  Negative for abdominal pain.  Genitourinary: Negative.  Negative for dysuria.  Musculoskeletal:  Positive for joint pain. Negative for back pain.  Skin: Negative.  Negative for rash.  Neurological: Negative.  Negative for dizziness, sensory change, focal weakness, weakness and headaches.  Psychiatric/Behavioral: Negative.  The patient is not nervous/anxious.    As per HPI. Otherwise, a complete review of systems is negative.  PAST MEDICAL HISTORY: Past Medical  History:  Diagnosis Date   Breast cancer [place] 01/[place]   right breast ca DCIS   COVID-19 09/2018   GERD (gastroesophageal reflux disease)    Hyperlipidemia    Hypertension    Hypothyroidism    Lumbar disc disease    MVP (mitral valve prolapse)    Personal history of radiation therapy    right breast ca. Finished 3/021   Pneumonia 09/2018   pt states she was positive for covid then as well    PAST SURGICAL HISTORY: Past Surgical History:  Procedure Laterality Date   ABDOMINAL HYSTERECTOMY     BREAST BIOPSY Right 01/08/2019   Affirm Biopsy- X-Clip- DCIS   BREAST BIOPSY Left 01/06/2021   stereo bx-asymmetry-"coil" clip-[facility]   BREAST LUMPECTOMY Right 02/13/2019   DCIS, negative LN,  close margins of 1 mm lateral   CARDIAC CATHETERIZATION N/A 06/02/2015   Procedure: Left Heart Cath and Coronary Angiography;  Surgeon: KTeodoro Spray MD;  [facility];  Service: Cardiovascular;  Laterality: N/A;   COLONOSCOPY WITH PROPOFOL N/A 01/27/2019   Procedure: COLONOSCOPY WITH PROPOFOL;  Surgeon: Toledo, TBenay Pike MD;  Location: [facility];  Service: Gastroenterology;  Laterality: N/A;   ESOPHAGOGASTRODUODENOSCOPY (EGD) WITH PROPOFOL N/A 01/27/2019   Procedure: ESOPHAGOGASTRODUODENOSCOPY (EGD) WITH PROPOFOL;  Surgeon: Toledo, TBenay Pike MD;  Location: [facility];  Service: Gastroenterology;  Laterality: N/A;   PARTIAL MASTECTOMY WITH AXILLARY SENTINEL LYMPH NODE BIOPSY Right 02/13/2019   Procedure: PARTIAL MASTECTOMY WITH AXILLARY SENTINEL LYMPH NODE BIOPSY;  Surgeon: SBenjamine Sprague DO;  Location: [facility];  Service: General;  Laterality: Right;   PARTIAL MASTECTOMY WITH AXILLARY SENTINEL LYMPH NODE BIOPSY Left 02/02/2021   Procedure: PARTIAL MASTECTOMY WITH AXILLARY SENTINEL LYMPH NODE BIOPSY & RF guided;  Surgeon: Benjamine Sprague, DO;  Location: [facility];  Service: General;  Laterality: Left;   STOMACH SURGERY     growth removed also removed gallbladder   THYROID SURGERY       FAMILY HISTORY: Family History  Problem Relation Age of Onset   Breast cancer Mother 38   Breast cancer Maternal Aunt    Breast cancer Maternal Aunt    Stomach cancer Other    Throat cancer Other     ADVANCED DIRECTIVES (Y/N):  N  HEALTH MAINTENANCE: Social History   Tobacco Use   Smoking status: Never   Smokeless tobacco: Never  Vaping Use   Vaping Use: Never used  Substance Use Topics   Alcohol use: Not Currently   Drug use: No     Colonoscopy:  PAP:  Bone density:  Lipid panel:  Allergies  Allergen Reactions   Ambien [Zolpidem] Other (See Comments)    erratic behavior/"made me crazy"   Latex Itching    redness   Paxil [Paroxetine] Other (See Comments)    Stomach pains.   Penicillins Other (See Comments)    Blisters in nose Did it involve swelling of the face/tongue/throat, SOB, or low BP? No Did it involve sudden or severe rash/hives, skin peeling, or any reaction on the inside of your mouth or nose? Yes--blisters in nose Did you need to seek medical attention at a hospital or doctor's office? No When did it last happen? More than 10 years ago    If all above answers are "NO", may proceed with cephalosporin use.     Current Outpatient Medications  Medication Sig Dispense Refill   ALPRAZolam (XANAX) 0.25 MG tablet Take 0.25 mg by mouth daily as needed for anxiety.     anastrozole (ARIMIDEX) 1 MG tablet Take 1 tablet (1 mg total) by mouth daily. 30 tablet 5   Biotin 1 MG CAPS Take 1 mg by mouth daily.     meclizine (ANTIVERT) 25 MG tablet Take by mouth.     olmesartan-hydrochlorothiazide (BENICAR HCT) 20-12.5 MG tablet Take 1 tablet by mouth daily.     amLODipine (NORVASC) 5 MG tablet Take 5 mg by mouth every morning.  (Patient not taking: Reported on 06/02/2021)  3   azithromycin (ZITHROMAX Z-PAK) 250 MG tablet 1 Z-pack, follow package directions. 6 each 0   Cholecalciferol 50 MCG (2000 UT) TABS Take 2,000 Units by mouth daily.  (Patient not taking:  Reported on 08/03/2021)     HYDROcodone-acetaminophen (NORCO/VICODIN) 5-325 MG tablet Take 1 tablet by mouth every 4 (four) hours as needed for moderate pain. (Patient not taking: Reported on 08/03/2021) 12 tablet 0   ibuprofen (ADVIL) 400 MG tablet Take 1 tablet (400 mg total) by mouth every 8 (eight) hours as needed for mild pain or moderate pain. (Patient not taking: Reported on 08/03/2021) 30 tablet 0   meloxicam (MOBIC) 7.5 MG tablet Take 7.5 mg by mouth daily.     omeprazole (PRILOSEC) 20 MG capsule Take 20 mg by mouth every morning.  (Patient not taking: Reported on 08/03/2021)  0   solifenacin (VESICARE) 5 MG tablet Take 5 mg by mouth daily. (Patient not taking: Reported on 08/03/2021)     No current facility-administered medications for this visit.    OBJECTIVE: Vitals:   08/03/21 1011  BP: (!) 144/79  Pulse: 67  Resp: 17  Temp: 98.4 F (36.9 C)  SpO2: 100%     Body mass index is 20.51 kg/m.    ECOG FS:0 -  Asymptomatic  General: Well-developed, well-nourished, no acute distress. Eyes: Pink conjunctiva, anicteric sclera. HEENT: Normocephalic, moist mucous membranes. Breast: Exam deferred today. Lungs: No audible wheezing or coughing. Heart: Regular rate and rhythm. Abdomen: Soft, nontender, no obvious distention. Musculoskeletal: No edema, cyanosis, or clubbing. Neuro: Alert, answering all questions appropriately. Cranial nerves grossly intact. Skin: No rashes or petechiae noted. Psych: Normal affect.  LAB RESULTS:  Lab Results  Component Value Date   NA 138 06/21/2021   K 4.0 06/21/2021   CL 101 06/21/2021   CO2 28 06/21/2021   GLUCOSE 112 (H) 06/21/2021   BUN 21 06/21/2021   CREATININE 1.06 (H) 06/21/2021   CALCIUM 9.7 06/21/2021   PROT 6.8 06/21/2021   ALBUMIN 4.0 06/21/2021   AST 22 06/21/2021   ALT 20 06/21/2021   ALKPHOS 48 06/21/2021   BILITOT 0.8 06/21/2021   GFRNONAA 54 (L) 06/21/2021   GFRAA >60 10/05/2018    Lab Results  Component Value Date   WBC  4.0 06/21/2021   NEUTROABS 2.9 05/19/2015   HGB 13.0 06/21/2021   HCT 39.0 06/21/2021   MCV 92.6 06/21/2021   PLT 231 06/21/2021     STUDIES: No results found.  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  Patient underwent lumpectomy on February 02, 2021 confirming stage of disease.  Given the fact that the tumor size was a T1a, she does not require Oncotype testing or chemotherapy.  Patient completed adjuvant XRT on April 12, 2021.  Continue anastrozole for a total of 5 years completing treatment in February 2028.  Return to clinic in 6 months for routine evaluation.   DCIS, right breast: Patient underwent lumpectomy on February 13, 2019.  Although patient had close margins of 1 mm, it was determined that reexcision is not necessary.  She proceeded directly with adjuvant XRT completing in March 2021.  Discontinue tamoxifen as above. Hypertension: Improved.  Patient's blood pressure is moderately elevated today.  Continue treatment and monitoring per primary care. Bilateral shoulder pain/deconditioning: Patient was given a referral to Occupational Therapy. Osteopenia: Bone mineral density on May 09, 2021 revealed a T score of -1.9.  Patient does not take calcium or vitamin D supplementation, but did agree to improve her dietary intake.  No other intervention is needed at this time.  Repeat in March 2024.  Patient expressed understanding and was in agreement with this plan. She also understands that She can call clinic at any time with any questions, concerns, or complaints.    Cancer Staging  Ductal carcinoma in situ (DCIS) of right breast Staging form: Breast, AJCC 8th Edition - Clinical stage  from 01/16/2019: Stage 0 (cTis (DCIS), cN0, cM0, ER+, PR+, HER2-) - Signed by Lloyd Huger, MD on 01/16/2019 Stage prefix: Initial diagnosis  Invasive ductal carcinoma of left breast [facility]) Staging form: Breast, AJCC 8th Edition - Pathologic stage from 02/10/2021: Stage IA (pT1a, pN0, cM0, G1, ER+, PR+, HER2-) - Signed by Lloyd Huger, MD on 02/10/2021 Stage prefix: Initial diagnosis Histologic grading system: 3 grade system   Lloyd Huger, MD   08/03/2021 3:30 PM

## 2021-08-03 ENCOUNTER — Encounter: Payer: Self-pay | Admitting: Oncology

## 2021-08-03 ENCOUNTER — Inpatient Hospital Stay: Payer: Medicare HMO | Attending: Internal Medicine | Admitting: Oncology

## 2021-08-03 VITALS — BP 144/79 | HR 67 | Temp 98.4°F | Resp 17 | Ht 60.0 in | Wt 105.0 lb

## 2021-08-03 DIAGNOSIS — D0511 Intraductal carcinoma in situ of right breast: Secondary | ICD-10-CM | POA: Insufficient documentation

## 2021-08-03 DIAGNOSIS — Z923 Personal history of irradiation: Secondary | ICD-10-CM | POA: Insufficient documentation

## 2021-08-03 DIAGNOSIS — Z79899 Other long term (current) drug therapy: Secondary | ICD-10-CM | POA: Diagnosis not present

## 2021-08-03 DIAGNOSIS — I10 Essential (primary) hypertension: Secondary | ICD-10-CM | POA: Insufficient documentation

## 2021-08-03 DIAGNOSIS — M8580 Other specified disorders of bone density and structure, unspecified site: Secondary | ICD-10-CM | POA: Diagnosis not present

## 2021-08-03 DIAGNOSIS — Z79811 Long term (current) use of aromatase inhibitors: Secondary | ICD-10-CM | POA: Insufficient documentation

## 2021-08-03 DIAGNOSIS — M25511 Pain in right shoulder: Secondary | ICD-10-CM | POA: Insufficient documentation

## 2021-08-03 DIAGNOSIS — Z170 Estrogen receptor positive status [ER+]: Secondary | ICD-10-CM | POA: Diagnosis not present

## 2021-08-03 DIAGNOSIS — M25512 Pain in left shoulder: Secondary | ICD-10-CM | POA: Diagnosis not present

## 2021-08-03 DIAGNOSIS — C50911 Malignant neoplasm of unspecified site of right female breast: Secondary | ICD-10-CM | POA: Insufficient documentation

## 2021-08-03 NOTE — Progress Notes (Signed)
Survivorship Care Plan visit completed.  Treatment summary reviewed and given to patient.  ASCO answers booklet reviewed and given to patient.  CARE program and Cancer Transitions discussed with patient along with other resources cancer center offers to patients and caregivers.  Patient verbalized understanding.    

## 2021-08-03 NOTE — Progress Notes (Signed)
Patient here for oncology follow-up appointment,  concerns of shoulder pain radiating to lower body and dizziness

## 2021-08-09 DIAGNOSIS — M50121 Cervical disc disorder at C4-C5 level with radiculopathy: Secondary | ICD-10-CM | POA: Diagnosis not present

## 2021-08-09 DIAGNOSIS — F32A Depression, unspecified: Secondary | ICD-10-CM | POA: Diagnosis not present

## 2021-08-09 DIAGNOSIS — M50122 Cervical disc disorder at C5-C6 level with radiculopathy: Secondary | ICD-10-CM | POA: Diagnosis not present

## 2021-08-09 DIAGNOSIS — R252 Cramp and spasm: Secondary | ICD-10-CM | POA: Diagnosis not present

## 2021-08-09 DIAGNOSIS — C50912 Malignant neoplasm of unspecified site of left female breast: Secondary | ICD-10-CM | POA: Diagnosis not present

## 2021-08-09 DIAGNOSIS — M5412 Radiculopathy, cervical region: Secondary | ICD-10-CM | POA: Diagnosis not present

## 2021-08-09 DIAGNOSIS — M50123 Cervical disc disorder at C6-C7 level with radiculopathy: Secondary | ICD-10-CM | POA: Diagnosis not present

## 2021-09-02 DIAGNOSIS — R0781 Pleurodynia: Secondary | ICD-10-CM | POA: Diagnosis not present

## 2021-09-02 DIAGNOSIS — C50919 Malignant neoplasm of unspecified site of unspecified female breast: Secondary | ICD-10-CM | POA: Diagnosis not present

## 2021-09-02 DIAGNOSIS — F32A Depression, unspecified: Secondary | ICD-10-CM | POA: Diagnosis not present

## 2021-09-02 DIAGNOSIS — M5412 Radiculopathy, cervical region: Secondary | ICD-10-CM | POA: Diagnosis not present

## 2021-09-02 DIAGNOSIS — J700 Acute pulmonary manifestations due to radiation: Secondary | ICD-10-CM | POA: Diagnosis not present

## 2021-09-02 DIAGNOSIS — R590 Localized enlarged lymph nodes: Secondary | ICD-10-CM | POA: Diagnosis not present

## 2021-09-07 DIAGNOSIS — M549 Dorsalgia, unspecified: Secondary | ICD-10-CM | POA: Diagnosis not present

## 2021-09-15 ENCOUNTER — Emergency Department: Payer: Medicare HMO

## 2021-09-15 ENCOUNTER — Other Ambulatory Visit: Payer: Self-pay

## 2021-09-15 ENCOUNTER — Emergency Department
Admission: EM | Admit: 2021-09-15 | Discharge: 2021-09-15 | Disposition: A | Discharge status: Home / Self Care | Payer: Medicare HMO | Attending: Emergency Medicine | Admitting: Emergency Medicine

## 2021-09-15 ENCOUNTER — Encounter: Payer: Self-pay | Admitting: Emergency Medicine

## 2021-09-15 DIAGNOSIS — L509 Urticaria, unspecified: Secondary | ICD-10-CM | POA: Insufficient documentation

## 2021-09-15 DIAGNOSIS — K219 Gastro-esophageal reflux disease without esophagitis: Secondary | ICD-10-CM | POA: Insufficient documentation

## 2021-09-15 DIAGNOSIS — R0602 Shortness of breath: Secondary | ICD-10-CM | POA: Diagnosis not present

## 2021-09-15 DIAGNOSIS — R079 Chest pain, unspecified: Secondary | ICD-10-CM

## 2021-09-15 DIAGNOSIS — R0789 Other chest pain: Secondary | ICD-10-CM | POA: Diagnosis not present

## 2021-09-15 LAB — COMPREHENSIVE METABOLIC PANEL
ALT: 14 U/L (ref 0–44)
AST: 15 U/L (ref 15–41)
Albumin: 3.8 g/dL (ref 3.5–5.0)
Alkaline Phosphatase: 49 U/L (ref 38–126)
Anion gap: 10 (ref 5–15)
BUN: 44 mg/dL — ABNORMAL HIGH (ref 8–23)
CO2: 25 mmol/L (ref 22–32)
Calcium: 9.3 mg/dL (ref 8.9–10.3)
Chloride: 103 mmol/L (ref 98–111)
Creatinine, Ser: 1.47 mg/dL — ABNORMAL HIGH (ref 0.44–1.00)
GFR, Estimated: 36 mL/min — ABNORMAL LOW (ref >60)
Glucose, Bld: 105 mg/dL — ABNORMAL HIGH (ref 70–99)
Potassium: 4.5 mmol/L (ref 3.5–5.1)
Sodium: 138 mmol/L (ref 135–145)
Total Bilirubin: 0.9 mg/dL (ref 0.3–1.2)
Total Protein: 6.3 g/dL — ABNORMAL LOW (ref 6.5–8.1)

## 2021-09-15 LAB — CBC WITH DIFFERENTIAL/PLATELET
Abs Immature Granulocytes: 0.12 10*3/uL — ABNORMAL HIGH (ref 0.00–0.07)
Basophils Absolute: 0 10*3/uL (ref 0.0–0.1)
Basophils Relative: 0 %
Eosinophils Absolute: 0.2 10*3/uL (ref 0.0–0.5)
Eosinophils Relative: 2 %
HCT: 36.5 % (ref 36.0–46.0)
Hemoglobin: 12.4 g/dL (ref 12.0–15.0)
Immature Granulocytes: 1 %
Lymphocytes Relative: 7 %
Lymphs Abs: 0.7 10*3/uL (ref 0.7–4.0)
MCH: 34 pg (ref 26.0–34.0)
MCHC: 34 g/dL (ref 30.0–36.0)
MCV: 100 fL (ref 80.0–100.0)
Monocytes Absolute: 0.7 10*3/uL (ref 0.1–1.0)
Monocytes Relative: 7 %
Neutro Abs: 8.5 10*3/uL — ABNORMAL HIGH (ref 1.7–7.7)
Neutrophils Relative %: 83 %
Platelets: 260 10*3/uL (ref 150–400)
RBC: 3.65 MIL/uL — ABNORMAL LOW (ref 3.87–5.11)
RDW: 13.2 % (ref 11.5–15.5)
WBC: 10.3 10*3/uL (ref 4.0–10.5)
nRBC: 0 % (ref 0.0–0.2)

## 2021-09-15 LAB — URINALYSIS, ROUTINE W REFLEX MICROSCOPIC
Bilirubin Urine: NEGATIVE
Glucose, UA: NEGATIVE mg/dL
Hgb urine dipstick: NEGATIVE
Ketones, ur: NEGATIVE mg/dL
Leukocytes,Ua: NEGATIVE
Nitrite: NEGATIVE
Protein, ur: NEGATIVE mg/dL
Specific Gravity, Urine: 1.016 (ref 1.005–1.030)
pH: 5 (ref 5.0–8.0)

## 2021-09-15 LAB — LIPASE, BLOOD: Lipase: 26 U/L (ref 11–51)

## 2021-09-15 LAB — TROPONIN I (HIGH SENSITIVITY)
Troponin I (High Sensitivity): 5 ng/L (ref <18)
Troponin I (High Sensitivity): 5 ng/L (ref <18)

## 2021-09-15 MED ORDER — HYDROXYZINE HCL 25 MG PO TABS
25.0000 mg | ORAL_TABLET | Freq: Once | ORAL | Status: AC
  Administered 2021-09-15: 25 mg via ORAL
  Filled 2021-09-15: qty 1

## 2021-09-15 MED ORDER — HYDROXYZINE PAMOATE 25 MG PO CAPS: 25.0000 mg | ORAL_CAPSULE | Freq: Three times a day (TID) | ORAL | 0 refills | Status: DC | PRN

## 2021-09-15 MED ORDER — ALUM & MAG HYDROXIDE-SIMETH 200-200-20 MG/5ML PO SUSP
30.0000 mL | Freq: Once | ORAL | Status: AC
  Administered 2021-09-15: 30 mL via ORAL
  Filled 2021-09-15: qty 30

## 2021-09-15 MED ORDER — OMEPRAZOLE 10 MG PO CPDR: 10.0000 mg | DELAYED_RELEASE_CAPSULE | Freq: Every day | ORAL | 1 refills | Status: AC

## 2021-09-15 NOTE — ED Provider Notes (Signed)
[facility] Provider Note  Patient Contact: 8:45 PM (approximate)   History   Chest Pain   HPI  Chelsea Peters is a 81 y.o. female who presents the emergency department complaining of intermittent chest pain.  Patient has had chest pain off and on but it was worse last night.  States that it lasted several hours prompting her to come to the emergency department today.  No chest pain currently.  Patient denies any shortness of breath.  She is concerned that she may have some acid reflux.  She does have a history of a previous GI/stomach surgery and states that she has had some reflux symptoms over the years.  No cardiac history.  Patient does not have a cardiologist.  Patient states that she is also having a rash that she noted to her chest and back.  It does itch.  No new foods, medications, soaps, topicals, shampoos.  She has not taken any medication for either complaint.     Physical Exam   Triage Vital Signs: ED Triage Vitals  Enc Vitals Group     BP 09/15/21 1619 122/68     Pulse Rate 09/15/21 1619 (!) 59     Resp 09/15/21 1619 16     Temp 09/15/21 1619 98 F (36.7 C)     Temp Source 09/15/21 1619 Oral     SpO2 09/15/21 1619 99 %     Weight 09/15/21 1619 104 lb (47.2 kg)     Height 09/15/21 1619 '5\' 1"'$  (1.549 m)     Head Circumference --      Peak Flow --      Pain Score 09/15/21 1638 0     Pain Loc --      Pain Edu? --      Excl. in [place]? --     Most recent vital signs: Vitals:   09/15/21 1619  BP: 122/68  Pulse: (!) 59  Resp: 16  Temp: 98 F (36.7 C)  SpO2: 99%     General: Alert and in no acute distress.  Neck: No stridor.   Cardiovascular:  Good peripheral perfusion.  Normal S1, S2.  No appreciable murmurs, rubs, gallops. Respiratory: Normal respiratory effort without tachypnea or retractions. Lungs CTAB. Good air entry to the bases with no decreased or absent breath sounds Gastrointestinal: Bowel sounds 4 quadrants. Soft and  nontender to palpation. No guarding or rigidity. No palpable masses. No distention. No CVA tenderness. Musculoskeletal: Full range of motion to all extremities.  Neurologic:  No gross focal neurologic deficits are appreciated.  Skin:   No rash noted Other:   ED Results / Procedures / Treatments   Labs (all labs ordered are listed, but only abnormal results are displayed) Labs Reviewed  COMPREHENSIVE METABOLIC PANEL - Abnormal; Notable for the following components:      Result Value   Glucose, Bld 105 (*)    BUN 44 (*)    Creatinine, Ser 1.47 (*)    Total Protein 6.3 (*)    GFR, Estimated 36 (*)    All other components within normal limits  CBC WITH DIFFERENTIAL/PLATELET - Abnormal; Notable for the following components:   RBC 3.65 (*)    Neutro Abs 8.5 (*)    Abs Immature Granulocytes 0.12 (*)    All other components within normal limits  URINALYSIS, ROUTINE W REFLEX MICROSCOPIC - Abnormal; Notable for the following components:   Color, Urine YELLOW (*)    APPearance HAZY (*)  All other components within normal limits  LIPASE, BLOOD  TROPONIN I (HIGH SENSITIVITY)  TROPONIN I (HIGH SENSITIVITY)     EKG  ED ECG REPORT I, Charline Bills Rickey Sadowski,  personally viewed and interpreted this ECG.   Date: 09/15/2021  EKG Time: 1630 hrs.  Rate: 61 bpm  Rhythm: unchanged from previous tracings, normal sinus rhythm, no significant changes when compared to 06/21/2021  Axis: Normal axis  Intervals:none  ST&T Change: No ST elevation or depression noted.  Normal sinus rhythm.  No STEMI.  Compared to previous EKG from 06/21/2021 no significant changes.    RADIOLOGY  I personally viewed, evaluated, and interpreted these images as part of my medical decision making, as well as reviewing the written report by the radiologist.  ED Provider Interpretation: No acute cardiopulmonary findings on chest x-ray.  DG Chest 2 View  Result Date: 09/15/2021 CLINICAL DATA:  Intermittent chest  pain and shortness of breath, pain radiating to left arm EXAM: CHEST - 2 VIEW COMPARISON:  820 FINDINGS: Frontal and lateral views of the chest demonstrate an unremarkable cardiac silhouette. No acute airspace disease, effusion, or pneumothorax. No acute bony abnormality. IMPRESSION: 1. No acute intrathoracic process. Electronically Signed   By: Randa Ngo M.D.   On: 09/15/2021 17:21    PROCEDURES:  Critical Care performed: No  Procedures   MEDICATIONS ORDERED IN ED: Medications  alum & mag hydroxide-simeth (MAALOX/MYLANTA) 200-200-20 MG/5ML suspension 30 mL (has no administration in time range)  hydrOXYzine (ATARAX) tablet 25 mg (has no administration in time range)     IMPRESSION / MDM / ASSESSMENT AND PLAN / ED COURSE  I reviewed the triage vital signs and the nursing notes.                              Differential diagnosis includes, but is not limited to, STEMI, NSTEMI, nonspecific chest pain, pneumonia, bronchitis, GERD, esophageal tear, achalasia, food bolus, allergic reaction, hives, cellulitis, tickborne illness  Patient's presentation is most consistent with acute presentation with potential threat to life or bodily function.   Patient's diagnosis is consistent with nonspecific chest pain, GERD, hives.  Patient presents to the ED with a complaint intermittent chest pain worse last night as well as hives.  Overall exam is reassuring.  Patient does have what appears to be hives on physical exam.  No signs and symptoms of anaphylaxis.  Patient has no other symptoms and no tick bites to be concern for [facility] spotted fever.  Patient did have chest pain but is likely GI according to the symptoms and patient's description.  Cardiac work-up today is reassuring with no elevation in troponins, reassuring EKG.  At this time I do recommend following up with cardiology as well as GI.  Patient will have GI cocktail and Vistaril for symptoms here in the emergency department.  Omeprazole  and Vistaril for symptoms at home.  Concerning signs and symptoms to return to the ED are discussed with the patient and family.  Patient is stable for discharge at this time..  Patient is given ED precautions to return to the ED for any worsening or new symptoms.        FINAL CLINICAL IMPRESSION(S) / ED DIAGNOSES   Final diagnoses:  Nonspecific chest pain  Gastroesophageal reflux disease, unspecified whether esophagitis present  Hives     Rx / DC Orders   ED Discharge Orders  Ordered    Ambulatory referral to Cardiology       Comments: If you have not heard from the Cardiology office within the next 72 hours please call [phone].   09/15/21 2153    hydrOXYzine (VISTARIL) 25 MG capsule  3 times daily PRN        09/15/21 2154    omeprazole (PRILOSEC) 10 MG capsule  Daily        09/15/21 2154             Note:  This document was prepared using Dragon voice recognition software and may include unintentional dictation errors.   Brynda Peon 09/15/21 2158    Rada Hay, MD 09/16/21 [phone]

## 2021-09-15 NOTE — ED Provider Triage Note (Signed)
Emergency Medicine Provider Triage Evaluation Note  Chelsea Peters , a 81 y.o. female  was evaluated in triage.  Pt complains of chest pain.  Patient presents to the ED with intermittent chest pain shortness of breath over the last week to 10 days.  It does radiate up into the patient's neck and down her left arm when it is present.  Patient states that she has had some orthopnea as well as exertional dyspnea.  Review of Systems  Positive: Chest pain, shortness of breath, worsening orthopnea Negative: Lower extremity edema, abdominal complaints, urinary complaints  Physical Exam  BP 122/68 (BP Location: Left Arm)   Pulse (!) 59   Temp 98 F (36.7 C) (Oral)   Resp 16   Ht '5\' 1"'$  (1.549 m)   Wt 47.2 kg   SpO2 99%   BMI 19.65 kg/m  Gen:   Awake, no distress   Resp:  Normal effort  MSK:   Moves extremities without difficulty  Other:    Medical Decision Making  Medically screening exam initiated at 4:38 PM.  Appropriate orders placed.  Otis Dials was informed that the remainder of the evaluation will be completed by another provider, this initial triage assessment does not replace that evaluation, and the importance of remaining in the ED until their evaluation is complete.  Patient arrives with chest pain intermittently over the last 7 to 10 days.  History of arrhythmia but no other cardiac history.  Patient is having some orthopnea, exertional dyspnea.  No peripheral edema.   Darletta Moll, PA-C 09/15/21 1638

## 2021-09-15 NOTE — ED Triage Notes (Addendum)
Patient reports indigestion last night that kept her very restless and awake the whole night. Pain was in epistatic area and radiated into right side. Patient spoke with her PCP and sent her here for cardiac work up. Pain has resolved during triage.   Patient also has new rash covering trunk and back onset of waking up this morning.

## 2021-09-16 DIAGNOSIS — K219 Gastro-esophageal reflux disease without esophagitis: Secondary | ICD-10-CM | POA: Diagnosis not present

## 2021-09-16 DIAGNOSIS — M5010 Cervical disc disorder with radiculopathy, unspecified cervical region: Secondary | ICD-10-CM | POA: Diagnosis not present

## 2021-09-16 DIAGNOSIS — L509 Urticaria, unspecified: Secondary | ICD-10-CM | POA: Diagnosis not present

## 2021-09-16 DIAGNOSIS — R0789 Other chest pain: Secondary | ICD-10-CM | POA: Diagnosis not present

## 2021-09-28 DIAGNOSIS — M50322 Other cervical disc degeneration at C5-C6 level: Secondary | ICD-10-CM | POA: Diagnosis not present

## 2021-09-28 DIAGNOSIS — M4312 Spondylolisthesis, cervical region: Secondary | ICD-10-CM | POA: Diagnosis not present

## 2021-09-28 DIAGNOSIS — M5412 Radiculopathy, cervical region: Secondary | ICD-10-CM | POA: Diagnosis not present

## 2021-10-13 DIAGNOSIS — F32A Depression, unspecified: Secondary | ICD-10-CM | POA: Diagnosis not present

## 2021-10-13 DIAGNOSIS — M542 Cervicalgia: Secondary | ICD-10-CM | POA: Diagnosis not present

## 2021-10-13 DIAGNOSIS — K2960 Other gastritis without bleeding: Secondary | ICD-10-CM | POA: Diagnosis not present

## 2021-10-14 DIAGNOSIS — M5412 Radiculopathy, cervical region: Secondary | ICD-10-CM | POA: Diagnosis not present

## 2021-10-14 DIAGNOSIS — M5030 Other cervical disc degeneration, unspecified cervical region: Secondary | ICD-10-CM | POA: Diagnosis not present

## 2021-10-19 ENCOUNTER — Ambulatory Visit
Admission: RE | Admit: 2021-10-19 | Discharge: 2021-10-19 | Disposition: A | Discharge status: Home / Self Care | Payer: Medicare HMO | Source: Ambulatory Visit | Attending: Radiation Oncology | Admitting: Radiation Oncology

## 2021-10-19 ENCOUNTER — Encounter: Payer: Self-pay | Admitting: Radiation Oncology

## 2021-10-19 VITALS — BP 158/78 | HR 63 | Temp 97.0°F | Resp 16 | Ht 61.0 in | Wt 106.3 lb

## 2021-10-19 DIAGNOSIS — C50512 Malignant neoplasm of lower-outer quadrant of left female breast: Secondary | ICD-10-CM | POA: Insufficient documentation

## 2021-10-19 DIAGNOSIS — Z923 Personal history of irradiation: Secondary | ICD-10-CM | POA: Insufficient documentation

## 2021-10-19 DIAGNOSIS — Z79811 Long term (current) use of aromatase inhibitors: Secondary | ICD-10-CM | POA: Diagnosis not present

## 2021-10-19 DIAGNOSIS — Z170 Estrogen receptor positive status [ER+]: Secondary | ICD-10-CM | POA: Insufficient documentation

## 2021-10-19 NOTE — Progress Notes (Signed)
Radiation Oncology Follow up Note  Name: Chelsea Peters   Date:   10/19/2021 MRN:  [id number] DOB: 07-31-1940    This 81 y.o. female presents to the clinic today for 50-monthfollow-up status post whole breast radiation to her left breast for stage Ia ER/PR positive invasive mammary carcinoma.  REFERRING PROVIDER: MRusty Aus MD  HPI: Patient is a 81year old female now at 6 months having completed whole breast radiation to her left breast for ER/PR positive stage Ia invasive mammary carcinoma.  Seen today in routine follow-up for breast standpoint she is doing well specifically denies breast tenderness cough or bone pain.  She is currently on anastrozole.  She has been having some dyspepsia and is being worked up for that also is being worked up for possible cervical spine pathology and is scheduled to undergo an MRI for that.  He has not yet had a mammogram..  COMPLICATIONS OF TREATMENT: none  FOLLOW UP COMPLIANCE: keeps appointments   PHYSICAL EXAM:  BP (!) 158/78   Pulse 63   Temp (!) 97 F (36.1 C)   Resp 16   Ht '5\' 1"'$  (1.549 m)   Wt 106 lb 4.8 oz (48.2 kg)   BMI 20.09 kg/m  Lungs are clear to A&P cardiac examination essentially unremarkable with regular rate and rhythm. No dominant mass or nodularity is noted in either breast in 2 positions examined. Incision is well-healed. No axillary or supraclavicular adenopathy is appreciated. Cosmetic result is excellent.  Well-developed well-nourished patient in NAD. HEENT reveals PERLA, EOMI, discs not visualized.  Oral cavity is clear. No oral mucosal lesions are identified. Neck is clear without evidence of cervical or supraclavicular adenopathy. Lungs are clear to A&P. Cardiac examination is essentially unremarkable with regular rate and rhythm without murmur rub or thrill. Abdomen is benign with no organomegaly or masses noted. Motor sensory and DTR levels are equal and symmetric in the upper and lower extremities. Cranial nerves II  through XII are grossly intact. Proprioception is intact. No peripheral adenopathy or edema is identified. No motor or sensory levels are noted. Crude visual fields are within normal range.  RADIOLOGY RESULTS: No current films to review  PLAN: Present time patient is doing well with no evidence of disease from breast standpoint.  She is being worked up for cervical stenosis with future MRI scan.  I have also asked her to stop her anastrozole for 2 weeks to see if that improves her dyspepsia and she can report those results to medical oncology.  Otherwise am pleased with her overall progress.  Of asked to see her back in 6 months for follow-up.  I would like to take this opportunity to thank you for allowing me to participate in the care of your patient..Noreene Filbert MD

## 2021-10-21 ENCOUNTER — Other Ambulatory Visit: Payer: Self-pay | Admitting: Internal Medicine

## 2021-10-21 ENCOUNTER — Other Ambulatory Visit (HOSPITAL_COMMUNITY): Payer: Self-pay | Admitting: Internal Medicine

## 2021-10-21 DIAGNOSIS — M5010 Cervical disc disorder with radiculopathy, unspecified cervical region: Secondary | ICD-10-CM

## 2021-10-23 ENCOUNTER — Ambulatory Visit
Admission: RE | Admit: 2021-10-23 | Discharge: 2021-10-23 | Disposition: A | Discharge status: Home / Self Care | Payer: Medicare HMO | Source: Ambulatory Visit | Attending: Internal Medicine | Admitting: Internal Medicine

## 2021-10-23 DIAGNOSIS — M5010 Cervical disc disorder with radiculopathy, unspecified cervical region: Secondary | ICD-10-CM | POA: Insufficient documentation

## 2021-10-23 DIAGNOSIS — M50121 Cervical disc disorder at C4-C5 level with radiculopathy: Secondary | ICD-10-CM | POA: Diagnosis not present

## 2021-10-23 DIAGNOSIS — M5011 Cervical disc disorder with radiculopathy,  high cervical region: Secondary | ICD-10-CM | POA: Diagnosis not present

## 2021-10-23 DIAGNOSIS — M25511 Pain in right shoulder: Secondary | ICD-10-CM | POA: Diagnosis not present

## 2021-10-23 DIAGNOSIS — M25512 Pain in left shoulder: Secondary | ICD-10-CM | POA: Diagnosis not present

## 2021-10-25 DIAGNOSIS — M25562 Pain in left knee: Secondary | ICD-10-CM | POA: Diagnosis not present

## 2021-11-02 DIAGNOSIS — M25562 Pain in left knee: Secondary | ICD-10-CM | POA: Diagnosis not present

## 2021-11-11 DIAGNOSIS — M25562 Pain in left knee: Secondary | ICD-10-CM | POA: Diagnosis not present

## 2021-11-15 DIAGNOSIS — E782 Mixed hyperlipidemia: Secondary | ICD-10-CM | POA: Diagnosis not present

## 2021-11-18 DIAGNOSIS — M25562 Pain in left knee: Secondary | ICD-10-CM | POA: Diagnosis not present

## 2021-11-22 DIAGNOSIS — N289 Disorder of kidney and ureter, unspecified: Secondary | ICD-10-CM | POA: Diagnosis not present

## 2021-11-22 DIAGNOSIS — Z23 Encounter for immunization: Secondary | ICD-10-CM | POA: Diagnosis not present

## 2021-11-22 DIAGNOSIS — D509 Iron deficiency anemia, unspecified: Secondary | ICD-10-CM | POA: Diagnosis not present

## 2021-11-22 DIAGNOSIS — E039 Hypothyroidism, unspecified: Secondary | ICD-10-CM | POA: Diagnosis not present

## 2021-11-22 DIAGNOSIS — F32A Depression, unspecified: Secondary | ICD-10-CM | POA: Diagnosis not present

## 2021-11-28 DIAGNOSIS — Z0100 Encounter for examination of eyes and vision without abnormal findings: Secondary | ICD-10-CM | POA: Diagnosis not present

## 2021-11-28 DIAGNOSIS — H35372 Puckering of macula, left eye: Secondary | ICD-10-CM | POA: Diagnosis not present

## 2021-12-12 ENCOUNTER — Other Ambulatory Visit: Payer: Self-pay

## 2021-12-12 MED ORDER — ANASTROZOLE 1 MG PO TABS: 1.0000 mg | ORAL_TABLET | Freq: Every day | ORAL | 3 refills | Status: AC

## 2022-01-12 DIAGNOSIS — H26493 Other secondary cataract, bilateral: Secondary | ICD-10-CM | POA: Diagnosis not present

## 2022-01-12 DIAGNOSIS — Z0100 Encounter for examination of eyes and vision without abnormal findings: Secondary | ICD-10-CM | POA: Diagnosis not present

## 2022-01-18 DIAGNOSIS — D500 Iron deficiency anemia secondary to blood loss (chronic): Secondary | ICD-10-CM | POA: Diagnosis not present

## 2022-01-18 DIAGNOSIS — N289 Disorder of kidney and ureter, unspecified: Secondary | ICD-10-CM | POA: Diagnosis not present

## 2022-01-18 DIAGNOSIS — E039 Hypothyroidism, unspecified: Secondary | ICD-10-CM | POA: Diagnosis not present

## 2022-01-18 DIAGNOSIS — E538 Deficiency of other specified B group vitamins: Secondary | ICD-10-CM | POA: Diagnosis not present

## 2022-01-18 DIAGNOSIS — D0511 Intraductal carcinoma in situ of right breast: Secondary | ICD-10-CM | POA: Diagnosis not present

## 2022-01-25 DIAGNOSIS — I10 Essential (primary) hypertension: Secondary | ICD-10-CM | POA: Diagnosis not present

## 2022-01-25 DIAGNOSIS — F32A Depression, unspecified: Secondary | ICD-10-CM | POA: Diagnosis not present

## 2022-01-25 DIAGNOSIS — M5431 Sciatica, right side: Secondary | ICD-10-CM | POA: Diagnosis not present

## 2022-02-02 ENCOUNTER — Other Ambulatory Visit: Payer: Self-pay | Admitting: *Deleted

## 2022-02-02 ENCOUNTER — Inpatient Hospital Stay: Payer: Medicare HMO | Attending: Oncology | Admitting: Oncology

## 2022-02-02 VITALS — BP 155/74 | HR 65 | Temp 98.0°F | Resp 16 | Wt 103.0 lb

## 2022-02-02 DIAGNOSIS — C50912 Malignant neoplasm of unspecified site of left female breast: Secondary | ICD-10-CM

## 2022-02-02 DIAGNOSIS — Z923 Personal history of irradiation: Secondary | ICD-10-CM | POA: Insufficient documentation

## 2022-02-02 DIAGNOSIS — Z79811 Long term (current) use of aromatase inhibitors: Secondary | ICD-10-CM | POA: Insufficient documentation

## 2022-02-02 DIAGNOSIS — D0511 Intraductal carcinoma in situ of right breast: Secondary | ICD-10-CM

## 2022-02-02 DIAGNOSIS — M8580 Other specified disorders of bone density and structure, unspecified site: Secondary | ICD-10-CM | POA: Insufficient documentation

## 2022-02-02 DIAGNOSIS — Z170 Estrogen receptor positive status [ER+]: Secondary | ICD-10-CM | POA: Insufficient documentation

## 2022-02-02 DIAGNOSIS — I10 Essential (primary) hypertension: Secondary | ICD-10-CM | POA: Insufficient documentation

## 2022-02-02 DIAGNOSIS — Z79899 Other long term (current) drug therapy: Secondary | ICD-10-CM | POA: Insufficient documentation

## 2022-02-02 NOTE — Progress Notes (Signed)
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  INTERVAL HISTORY: Patient returns to clinic today for routine 80-monthevaluation.  Her shoulder pain has significantly improved although patient admits she never went to see occupational or physical therapy.  She currently feels well.  She is tolerating anastrozole without significant side effects.  She has no neurologic complaints.  She denies any recent fevers or illnesses.  She has a good appetite and denies weight loss.  She has no chest pain, shortness of breath, cough, or hemoptysis.  She denies any nausea, vomiting, constipation, or diarrhea.  She has no urinary complaints.  Patient offers no specific complaints today.  REVIEW OF SYSTEMS:   Review of Systems  Constitutional: Negative.  Negative for fever and malaise/fatigue.  Respiratory: Negative.  Negative for cough, hemoptysis and shortness of breath.   Cardiovascular: Negative.  Negative for chest pain and leg swelling.  Gastrointestinal: Negative.  Negative for abdominal pain.  Genitourinary: Negative.  Negative for dysuria.  Musculoskeletal: Negative.  Negative for back pain and joint pain.  Skin: Negative.  Negative for rash.  Neurological: Negative.  Negative for dizziness, sensory change, focal weakness, weakness and headaches.  Psychiatric/Behavioral: Negative.  The patient is not nervous/anxious.     As per HPI. Otherwise, a complete review of  systems is negative.  PAST MEDICAL HISTORY: Past Medical History:  Diagnosis Date   Breast cancer ([facility] 01/2019   right breast ca DCIS   COVID-19 09/2018   GERD (gastroesophageal reflux disease)    Hyperlipidemia    Hypertension    Hypothyroidism    Lumbar disc disease    MVP (mitral valve prolapse)    Personal history of radiation therapy    right breast ca. Finished 3/021   Pneumonia 09/2018   pt states she was positive for covid then as well    PAST SURGICAL HISTORY: Past Surgical History:  Procedure Laterality Date   ABDOMINAL HYSTERECTOMY     BREAST BIOPSY Right 01/08/2019   Affirm Biopsy- X-Clip- DCIS   BREAST BIOPSY Left 01/06/2021   stereo bx-asymmetry-"coil" clip-[facility]   BREAST LUMPECTOMY Right 02/13/2019   DCIS, negative LN,  close margins of 1 mm lateral   CARDIAC CATHETERIZATION N/A 06/02/2015   Procedure: Left Heart Cath and Coronary Angiography;  Surgeon: KTeodoro Spray MD;  [facility];  Service: Cardiovascular;  Laterality: N/A;   COLONOSCOPY WITH PROPOFOL N/A 01/27/2019   Procedure: COLONOSCOPY WITH PROPOFOL;  Surgeon: Toledo, TBenay Pike MD;  Location: [facility];  Service: Gastroenterology;  Laterality: N/A;   ESOPHAGOGASTRODUODENOSCOPY (EGD) WITH PROPOFOL N/A 01/27/2019   Procedure: ESOPHAGOGASTRODUODENOSCOPY (EGD) WITH PROPOFOL;  Surgeon: Toledo, TBenay Pike MD;  Location: [facility];  Service: Gastroenterology;  Laterality: N/A;   PARTIAL MASTECTOMY WITH AXILLARY SENTINEL LYMPH NODE BIOPSY Right 02/13/2019   Procedure: PARTIAL MASTECTOMY WITH AXILLARY SENTINEL LYMPH NODE BIOPSY;  Surgeon: SBenjamine Sprague DO;  Location: [facility];  Service: General;  Laterality: Right;   PARTIAL MASTECTOMY WITH AXILLARY SENTINEL LYMPH NODE BIOPSY Left 02/02/2021   Procedure: PARTIAL  MASTECTOMY WITH AXILLARY SENTINEL LYMPH NODE BIOPSY & RF guided;  Surgeon: Benjamine Sprague, DO;  Location: [facility];  Service: General;  Laterality: Left;   STOMACH SURGERY      growth removed also removed gallbladder   THYROID SURGERY      FAMILY HISTORY: Family History  Problem Relation Age of Onset   Breast cancer Mother 32   Breast cancer Maternal Aunt    Breast cancer Maternal Aunt    Stomach cancer Other    Throat cancer Other     ADVANCED DIRECTIVES (Y/N):  N  HEALTH MAINTENANCE: Social History   Tobacco Use   Smoking status: Never   Smokeless tobacco: Never  Vaping Use   Vaping Use: Never used  Substance Use Topics   Alcohol use: Not Currently   Drug use: No     Colonoscopy:  PAP:  Bone density:  Lipid panel:  Allergies  Allergen Reactions   Ambien [Zolpidem] Other (See Comments)    erratic behavior/"made me crazy"   Latex Itching    redness   Paxil [Paroxetine] Other (See Comments)    Stomach pains.   Penicillins Other (See Comments)    Blisters in nose Did it involve swelling of the face/tongue/throat, SOB, or low BP? No Did it involve sudden or severe rash/hives, skin peeling, or any reaction on the inside of your mouth or nose? Yes--blisters in nose Did you need to seek medical attention at a hospital or doctor's office? No When did it last happen? More than 10 years ago    If all above answers are "NO", may proceed with cephalosporin use.     Current Outpatient Medications  Medication Sig Dispense Refill   amLODipine (NORVASC) 5 MG tablet Take 5 mg by mouth every morning.  3   anastrozole (ARIMIDEX) 1 MG tablet Take 1 tablet (1 mg total) by mouth daily. 30 tablet 3   Biotin 1 MG CAPS Take 1 mg by mouth daily.     Cholecalciferol 50 MCG (2000 UT) TABS Take 2,000 Units by mouth daily.     gabapentin (NEURONTIN) 100 MG capsule Take 100 mg by mouth at bedtime.     ibuprofen (ADVIL) 400 MG tablet Take 1 tablet (400 mg total) by mouth every 8 (eight) hours as needed for mild pain or moderate pain. 30 tablet 0   omeprazole (PRILOSEC) 10 MG capsule Take 1 capsule (10 mg total) by mouth daily. 30 capsule 1   solifenacin  (VESICARE) 5 MG tablet Take 5 mg by mouth daily.     sucralfate (CARAFATE) 1 g tablet Take 1 g by mouth 4 (four) times daily -  with meals and at bedtime.     ALPRAZolam (XANAX) 0.25 MG tablet Take 0.25 mg by mouth daily as needed for anxiety. (Patient not taking: Reported on 02/02/2022)     meclizine (ANTIVERT) 25 MG tablet Take by mouth.     meloxicam (MOBIC) 7.5 MG tablet Take 7.5 mg by mouth daily. (Patient not taking: Reported on 10/19/2021)     No current facility-administered medications for this visit.    OBJECTIVE: Vitals:   02/02/22 0944  BP: (!) 155/74  Pulse: 65  Resp: 16  Temp: 98 F (36.7 C)  SpO2: 100%     Body mass index is 19.46 kg/m.    ECOG FS:0 - Asymptomatic  General: Well-developed, well-nourished, no acute distress. Eyes: Pink conjunctiva, anicteric sclera. HEENT: Normocephalic, moist mucous membranes. Breast: Exam deferred today. Lungs: No audible wheezing or coughing.  Heart: Regular rate and rhythm. Abdomen: Soft, nontender, no obvious distention. Musculoskeletal: No edema, cyanosis, or clubbing. Neuro: Alert, answering all questions appropriately. Cranial nerves grossly intact. Skin: No rashes or petechiae noted. Psych: Normal affect.  LAB RESULTS:  Lab Results  Component Value Date   NA 138 09/15/2021   K 4.5 09/15/2021   CL 103 09/15/2021   CO2 25 09/15/2021   GLUCOSE 105 (H) 09/15/2021   BUN 44 (H) 09/15/2021   CREATININE 1.47 (H) 09/15/2021   CALCIUM 9.3 09/15/2021   PROT 6.3 (L) 09/15/2021   ALBUMIN 3.8 09/15/2021   AST 15 09/15/2021   ALT 14 09/15/2021   ALKPHOS 49 09/15/2021   BILITOT 0.9 09/15/2021   GFRNONAA 36 (L) 09/15/2021   GFRAA >60 10/05/2018    Lab Results  Component Value Date   WBC 10.3 09/15/2021   NEUTROABS 8.5 (H) 09/15/2021   HGB 12.4 09/15/2021   HCT 36.5 09/15/2021   MCV 100.0 09/15/2021   PLT 260 09/15/2021     STUDIES: No results found.  ASSESSMENT: DCIS, right breast, now with pathologic stage Ia  ER/PR positive, HER2 negative invasive carcinoma of the left breast.  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  Patient underwent lumpectomy on February 02, 2021 confirming stage of disease.  Given the fact that the tumor size was a T1a, she does not require Oncotype testing or chemotherapy.  Patient completed adjuvant XRT on April 12, 2021.  Continue anastrozole for a total of 5 years completing treatment in February 2028.  Patient will require mammogram in the next 1 to 2 weeks.  Return to clinic in 6 months for routine evaluation.  DCIS, right breast: Patient underwent lumpectomy on February 13, 2019.  Although patient had close margins of 1 mm, it was determined that reexcision is not necessary.  She proceeded directly with adjuvant XRT completing in March 2021.  Continue anastrozole as above.   Hypertension: Patient's blood pressure is moderately elevated today.  Continue treatment and monitoring per primary care. Bilateral shoulder pain: Likely musculoskeletal, resolved.  Osteopenia: Bone mineral density on May 09, 2021 revealed a T score of -1.9.  Patient does not take calcium or vitamin D supplementation, but did agree to improve her dietary intake.  No other intervention is needed at this time.  Repeat bone mineral density due March 2024.  Patient expressed understanding and was in agreement with this plan. She also understands that She can call clinic at any time with any questions, concerns, or complaints.    Cancer Staging  Ductal carcinoma in situ (DCIS) of right breast Staging form: Breast, AJCC 8th Edition - Clinical stage from 01/16/2019: Stage 0 (cTis (DCIS), cN0, cM0, ER+, PR+, HER2-) - Signed by Lloyd Huger, MD on 01/16/2019 Stage prefix: Initial diagnosis  Invasive ductal carcinoma of left breast  [facility]) Staging form: Breast, AJCC 8th Edition - Pathologic stage from 02/10/2021: Stage IA (pT1a, pN0, cM0, G1, ER+, PR+, HER2-) - Signed by Lloyd Huger, MD on 02/10/2021 Stage prefix: Initial diagnosis Histologic grading system: 3 grade system   Lloyd Huger, MD   02/02/2022 10:36 AM

## 2022-02-02 NOTE — Progress Notes (Signed)
Patient doing fairly well. Has been taking her anastrazole. Denies any SE's. Has had shoulder pain which is resolving.

## 2022-02-17 ENCOUNTER — Ambulatory Visit
Admission: RE | Admit: 2022-02-17 | Discharge: 2022-02-17 | Disposition: A | Discharge status: Home / Self Care | Payer: Medicare HMO | Source: Ambulatory Visit | Attending: Oncology | Admitting: Oncology

## 2022-02-17 DIAGNOSIS — C50912 Malignant neoplasm of unspecified site of left female breast: Secondary | ICD-10-CM | POA: Insufficient documentation

## 2022-02-17 DIAGNOSIS — D0511 Intraductal carcinoma in situ of right breast: Secondary | ICD-10-CM | POA: Insufficient documentation

## 2022-02-17 DIAGNOSIS — R922 Inconclusive mammogram: Secondary | ICD-10-CM | POA: Diagnosis not present

## 2022-04-24 ENCOUNTER — Other Ambulatory Visit: Payer: Self-pay | Admitting: Medical Oncology

## 2022-04-24 MED ORDER — ANASTROZOLE 1 MG PO TABS: 1.0000 mg | ORAL_TABLET | Freq: Every day | ORAL | 3 refills | Status: DC

## 2022-05-04 ENCOUNTER — Encounter: Payer: Self-pay | Admitting: Radiation Oncology

## 2022-05-04 ENCOUNTER — Ambulatory Visit
Admission: RE | Admit: 2022-05-04 | Discharge: 2022-05-04 | Disposition: A | Discharge status: Home / Self Care | Payer: Medicare HMO | Source: Ambulatory Visit | Attending: Radiation Oncology | Admitting: Radiation Oncology

## 2022-05-04 VITALS — BP 134/73 | HR 63 | Temp 97.2°F | Resp 12 | Wt 100.0 lb

## 2022-05-04 DIAGNOSIS — Z79811 Long term (current) use of aromatase inhibitors: Secondary | ICD-10-CM | POA: Diagnosis not present

## 2022-05-04 DIAGNOSIS — Z170 Estrogen receptor positive status [ER+]: Secondary | ICD-10-CM | POA: Insufficient documentation

## 2022-05-04 DIAGNOSIS — C50512 Malignant neoplasm of lower-outer quadrant of left female breast: Secondary | ICD-10-CM | POA: Diagnosis not present

## 2022-05-04 DIAGNOSIS — Z923 Personal history of irradiation: Secondary | ICD-10-CM | POA: Insufficient documentation

## 2022-05-04 DIAGNOSIS — D0511 Intraductal carcinoma in situ of right breast: Secondary | ICD-10-CM | POA: Diagnosis not present

## 2022-05-04 NOTE — Progress Notes (Signed)
Radiation Oncology Follow up Note  Name: Chelsea Peters   Date:   05/04/2022 MRN:  [id number] DOB: 08/16/1940    This 82 y.o. female presents to the clinic today for 1 year follow-up status post whole breast radiation to her left breast for stage Ia ER/PR positive invasive mammary carcinoma.  REFERRING PROVIDER: Rusty Aus, MD  HPI: Patient is a 82 year old female seen 1 year out from whole breast radiation to her left breast for stage Ia ER/PR positive invasive mammary carcinoma  Specifically denies breast tenderness cough or bone pain..  She had mammograms back in November which I have reviewed were BI-RADS 2 benign.  She is currently on Arimidex tolerating that well without side effect.  COMPLICATIONS OF TREATMENT: none  FOLLOW UP COMPLIANCE: keeps appointments   PHYSICAL EXAM:  BP 134/73   Pulse 63   Temp (!) 97.2 F (36.2 C)   Resp 12   Wt 100 lb (45.4 kg)   BMI 18.89 kg/m  Lungs are clear to A&P cardiac examination essentially unremarkable with regular rate and rhythm. No dominant mass or nodularity is noted in either breast in 2 positions examined. Incision is well-healed. No axillary or supraclavicular adenopathy is appreciated. Cosmetic result is excellent.  Well-developed well-nourished patient in NAD. HEENT reveals PERLA, EOMI, discs not visualized.  Oral cavity is clear. No oral mucosal lesions are identified. Neck is clear without evidence of cervical or supraclavicular adenopathy. Lungs are clear to A&P. Cardiac examination is essentially unremarkable with regular rate and rhythm without murmur rub or thrill. Abdomen is benign with no organomegaly or masses noted. Motor sensory and DTR levels are equal and symmetric in the upper and lower extremities. Cranial nerves II through XII are grossly intact. Proprioception is intact. No peripheral adenopathy or edema is identified. No motor or sensory levels are noted. Crude visual fields are within normal range.Marland Kitchen  RADIOLOGY  RESULTS: Mammograms reviewed compatible with above-stated findings  PLAN: Present time patient is doing well with no evidence of disease 1 year out of asked to see her back in 1 year for follow-up.  Patient is to call with any concerns.  I would like to take this opportunity to thank you for allowing me to participate in the care of your patient.Noreene Filbert, MD

## 2022-05-11 ENCOUNTER — Other Ambulatory Visit: Payer: Medicare HMO

## 2022-05-22 DIAGNOSIS — E782 Mixed hyperlipidemia: Secondary | ICD-10-CM | POA: Diagnosis not present

## 2022-06-01 DIAGNOSIS — Z0000 Encounter for general adult medical examination without abnormal findings: Secondary | ICD-10-CM | POA: Diagnosis not present

## 2022-06-01 DIAGNOSIS — F32A Depression, unspecified: Secondary | ICD-10-CM | POA: Diagnosis not present

## 2022-06-01 DIAGNOSIS — Z1331 Encounter for screening for depression: Secondary | ICD-10-CM | POA: Diagnosis not present

## 2022-08-04 ENCOUNTER — Inpatient Hospital Stay: Payer: Medicare HMO | Admitting: Oncology

## 2022-08-17 ENCOUNTER — Emergency Department: Payer: Medicare HMO

## 2022-08-17 ENCOUNTER — Emergency Department
Admission: EM | Admit: 2022-08-17 | Discharge: 2022-08-17 | Disposition: A | Discharge status: Home / Self Care | Payer: Medicare HMO | Attending: Emergency Medicine | Admitting: Emergency Medicine

## 2022-08-17 ENCOUNTER — Other Ambulatory Visit: Payer: Self-pay

## 2022-08-17 DIAGNOSIS — R531 Weakness: Secondary | ICD-10-CM | POA: Insufficient documentation

## 2022-08-17 DIAGNOSIS — R42 Dizziness and giddiness: Secondary | ICD-10-CM | POA: Diagnosis not present

## 2022-08-17 DIAGNOSIS — I10 Essential (primary) hypertension: Secondary | ICD-10-CM | POA: Insufficient documentation

## 2022-08-17 DIAGNOSIS — E039 Hypothyroidism, unspecified: Secondary | ICD-10-CM | POA: Insufficient documentation

## 2022-08-17 DIAGNOSIS — R251 Tremor, unspecified: Secondary | ICD-10-CM | POA: Insufficient documentation

## 2022-08-17 DIAGNOSIS — R079 Chest pain, unspecified: Secondary | ICD-10-CM | POA: Diagnosis not present

## 2022-08-17 DIAGNOSIS — R5381 Other malaise: Secondary | ICD-10-CM | POA: Diagnosis not present

## 2022-08-17 DIAGNOSIS — Z853 Personal history of malignant neoplasm of breast: Secondary | ICD-10-CM | POA: Insufficient documentation

## 2022-08-17 DIAGNOSIS — R29818 Other symptoms and signs involving the nervous system: Secondary | ICD-10-CM | POA: Diagnosis not present

## 2022-08-17 LAB — CBC
HCT: 37.2 % (ref 36.0–46.0)
Hemoglobin: 12.9 g/dL (ref 12.0–15.0)
MCH: 31.3 pg (ref 26.0–34.0)
MCHC: 34.7 g/dL (ref 30.0–36.0)
MCV: 90.3 fL (ref 80.0–100.0)
Platelets: 223 10*3/uL (ref 150–400)
RBC: 4.12 MIL/uL (ref 3.87–5.11)
RDW: 12.9 % (ref 11.5–15.5)
WBC: 4.6 10*3/uL (ref 4.0–10.5)
nRBC: 0 % (ref 0.0–0.2)

## 2022-08-17 LAB — URINALYSIS, ROUTINE W REFLEX MICROSCOPIC
Bilirubin Urine: NEGATIVE
Glucose, UA: NEGATIVE mg/dL
Hgb urine dipstick: NEGATIVE
Ketones, ur: NEGATIVE mg/dL
Leukocytes,Ua: NEGATIVE
Nitrite: NEGATIVE
Protein, ur: NEGATIVE mg/dL
Specific Gravity, Urine: 1.004 — ABNORMAL LOW (ref 1.005–1.030)
pH: 7 (ref 5.0–8.0)

## 2022-08-17 LAB — MAGNESIUM: Magnesium: 2 mg/dL (ref 1.7–2.4)

## 2022-08-17 LAB — BASIC METABOLIC PANEL
Anion gap: 8 (ref 5–15)
BUN: 19 mg/dL (ref 8–23)
CO2: 26 mmol/L (ref 22–32)
Calcium: 9.3 mg/dL (ref 8.9–10.3)
Chloride: 104 mmol/L (ref 98–111)
Creatinine, Ser: 0.92 mg/dL (ref 0.44–1.00)
GFR, Estimated: >60 mL/min (ref >60)
Glucose, Bld: 93 mg/dL (ref 70–99)
Potassium: 3.8 mmol/L (ref 3.5–5.1)
Sodium: 138 mmol/L (ref 135–145)

## 2022-08-17 LAB — HEPATIC FUNCTION PANEL
ALT: 11 U/L (ref 0–44)
AST: 20 U/L (ref 15–41)
Albumin: 4.2 g/dL (ref 3.5–5.0)
Alkaline Phosphatase: 62 U/L (ref 38–126)
Bilirubin, Direct: <0.1 mg/dL (ref 0.0–0.2)
Total Bilirubin: 0.8 mg/dL (ref 0.3–1.2)
Total Protein: 6.4 g/dL — ABNORMAL LOW (ref 6.5–8.1)

## 2022-08-17 LAB — TROPONIN I (HIGH SENSITIVITY): Troponin I (High Sensitivity): 4 ng/L (ref <18)

## 2022-08-17 LAB — TSH: TSH: 0.029 u[IU]/mL — ABNORMAL LOW (ref 0.350–4.500)

## 2022-08-17 LAB — T4, FREE: Free T4: 0.95 ng/dL (ref 0.61–1.12)

## 2022-08-17 MED ORDER — ACETAMINOPHEN 160 MG/5ML PO SOLN
650.0000 mg | Freq: Once | ORAL | Status: AC
  Administered 2022-08-17: 650 mg via ORAL
  Filled 2022-08-17: qty 20.3

## 2022-08-17 MED ORDER — ACETAMINOPHEN 500 MG PO TABS
1000.0000 mg | ORAL_TABLET | Freq: Once | ORAL | Status: DC
  Filled 2022-08-17: qty 2

## 2022-08-17 MED ORDER — LACTATED RINGERS IV BOLUS
1000.0000 mL | Freq: Once | INTRAVENOUS | Status: AC
  Administered 2022-08-17: 1000 mL via INTRAVENOUS

## 2022-08-17 NOTE — ED Triage Notes (Signed)
Pt here with bilateral numbness. Pt states she has the numbness on occasion for a few weeks. Pt also c/o back pain, denies injury.

## 2022-08-17 NOTE — ED Notes (Signed)
See triage notes. Patient has multiple complaints. Patient stated that she has been feeling faint and dizzy for "the past while." Patient denied any sickness. Patient stated that she sometimes has a hard time breathing.

## 2022-08-17 NOTE — ED Notes (Signed)
Pt verbalizes understanding of discharge instructions. Opportunity for questioning and answers were provided. Pt discharged from ED to home with family.    

## 2022-08-17 NOTE — ED Notes (Signed)
First nurse note:  Pt to ED from [facility] sent by doctor for cardiac eval for following symptoms ongoing since 6 months, worse since last 1 week: Weakness, SOB, parasthesias to whole body, chest tightness, cannot catch her breath, anxiety, and "all over body numbness".  Pt in no acute distress in lobby with family member.

## 2022-08-17 NOTE — ED Provider Notes (Signed)
[facility] Provider Note    Event Date/Time   First MD Initiated Contact with Patient 08/17/22 1554     (approximate)   History   Chief Complaint Weakness  HPI  Chelsea Peters is a 82 y.o. female with past medical history of hypertension, hyperlipidemia, breast cancer, and hypothyroidism who presents to the ED complaining of weakness.  Patient reports that over the past 2 to 3 days she has been feeling increasingly generally weak and malaised.  She denies any fevers but has had some tightness in her chest and mild difficulty breathing.  She states she deals with dizziness and feeling unsteady when she walks chronically, however this seemed to get acutely worse over the past 2 days.  She has not had any nausea, vomiting, abdominal pain, or dysuria.  She denies any changes in her bowel movements.  She is not aware of any sick contacts.     Physical Exam   Triage Vital Signs: ED Triage Vitals  Enc Vitals Group     BP 08/17/22 1412 (!) 172/81     Pulse Rate 08/17/22 1412 70     Resp 08/17/22 1412 18     Temp 08/17/22 1412 98 F (36.7 C)     Temp Source 08/17/22 1412 Oral     SpO2 08/17/22 1412 100 %     Weight 08/17/22 1411 100 lb 1.4 oz (45.4 kg)     Height 08/17/22 1411 5\' 1"  (1.549 m)     Head Circumference --      Peak Flow --      Pain Score 08/17/22 1412 4     Pain Loc --      Pain Edu? --      Excl. in GC? --     Most recent vital signs: Vitals:   08/17/22 1412  BP: (!) 172/81  Pulse: 70  Resp: 18  Temp: 98 F (36.7 C)  SpO2: 100%    Constitutional: Alert and oriented. Eyes: Conjunctivae are normal. Head: Atraumatic. Nose: No congestion/rhinnorhea. Mouth/Throat: Mucous membranes are moist.  Cardiovascular: Normal rate, regular rhythm. Grossly normal heart sounds.  2+ radial pulses bilaterally. Respiratory: Normal respiratory effort.  No retractions. Lungs CTAB. Gastrointestinal: Soft and nontender. No  distention. Musculoskeletal: No lower extremity tenderness nor edema.  Neurologic:  Normal speech and language.  Tremulous with no gross focal neurologic deficits appreciated.    ED Results / Procedures / Treatments   Labs (all labs ordered are listed, but only abnormal results are displayed) Labs Reviewed  URINALYSIS, ROUTINE W REFLEX MICROSCOPIC - Abnormal; Notable for the following components:      Result Value   Color, Urine COLORLESS (*)    APPearance CLEAR (*)    Specific Gravity, Urine 1.004 (*)    All other components within normal limits  HEPATIC FUNCTION PANEL - Abnormal; Notable for the following components:   Total Protein 6.4 (*)    All other components within normal limits  TSH - Abnormal; Notable for the following components:   TSH 0.029 (*)    All other components within normal limits  BASIC METABOLIC PANEL  CBC  MAGNESIUM  T4, FREE  CBG MONITORING, ED  TROPONIN I (HIGH SENSITIVITY)     EKG  ED ECG REPORT I, Chesley Noon, the attending physician, personally viewed and interpreted this ECG.   Date: 08/17/2022  EKG Time: 14:15  Rate: 70  Rhythm: normal sinus rhythm  Axis: Normal  Intervals:none  ST&T Change: None  RADIOLOGY CT head reviewed and interpreted by me with no hemorrhage or midline shift.  PROCEDURES:  Critical Care performed: No  Procedures   MEDICATIONS ORDERED IN ED: Medications  lactated ringers bolus 1,000 mL (1,000 mLs Intravenous New Bag/Given 08/17/22 1734)     IMPRESSION / MDM / ASSESSMENT AND PLAN / ED COURSE  I reviewed the triage vital signs and the nursing notes.                              82 y.o. female with past medical history of hypertension, hyperlipidemia, breast cancer, and hypothyroidism who presents to the ED complaining of generalized weakness, malaise, chest tightness, and dizziness with diffuse tremor for the past 2 days.  Patient's presentation is most consistent with acute presentation with  potential threat to life or bodily function.  Differential diagnosis includes, but is not limited to, stroke, TIA, electrolyte abnormality, dehydration, electrolyte abnormality, AKI, anemia, pneumonia, UTI, hypothyroidism.  Patient nontoxic-appearing and in no acute distress, vital signs are unremarkable.  She is slightly tremulous but has no focal neurologic deficits on exam.  Labs without significant anemia, leukocytosis, showed abnormality, or AKI.  With her chest tightness, we will check EKG and add on troponin.  Patient also describes dizziness and unsteady gait, will check CT head.  Plan to assess for infectious process with chest x-ray and urinalysis.  We will also add on thyroid studies and magnesium level, hydrate with IV fluids and reassess.  CT head is negative for acute process, chest x-ray also reassuring and urinalysis shows no signs of infection.  Troponin within normal limits and I doubt ACS, LFTs and magnesium also unremarkable.  Patient did have low TSH but free T4 within normal limits and I doubt significant hypothyroidism.  Patient continues to feel dizzy on reassessment, will check MRI brain for evidence of acute stroke.  If this is unremarkable, patient be appropriate for discharge home with outpatient neurology follow-up.      FINAL CLINICAL IMPRESSION(S) / ED DIAGNOSES   Final diagnoses:  Dizziness  Generalized weakness  Tremor     Rx / DC Orders   ED Discharge Orders     None        Note:  This document was prepared using Dragon voice recognition software and may include unintentional dictation errors.   Chesley Noon, MD 08/17/22 1911

## 2022-08-17 NOTE — ED Provider Notes (Signed)
Assumed patient care from Dr. Chesley Noon, MD. MRI without contrast shows no acute abnormality.  On reevaluation, patient reports that her dizzy spells have increased since two family members have moved into her home.  She does report that she has been under increased stress.  Daughter reports that patient was recently started on antidepressant for anxiety and depression.  I did recommend reaching back out to primary care to discuss possible pharmacologic management for worsening anxiety.  Patient feels comfortable being discharged.   Pia Mau [place], Cordelia Poche 08/17/22 2145    Chesley Noon, MD 08/22/22 [phone]

## 2022-08-17 NOTE — ED Notes (Signed)
Called lab, they're on way to draw blood.

## 2022-08-29 DIAGNOSIS — R251 Tremor, unspecified: Secondary | ICD-10-CM | POA: Diagnosis not present

## 2022-08-29 DIAGNOSIS — R531 Weakness: Secondary | ICD-10-CM | POA: Diagnosis not present

## 2022-08-29 DIAGNOSIS — R200 Anesthesia of skin: Secondary | ICD-10-CM | POA: Diagnosis not present

## 2022-08-29 DIAGNOSIS — R42 Dizziness and giddiness: Secondary | ICD-10-CM | POA: Diagnosis not present

## 2022-08-29 DIAGNOSIS — R2689 Other abnormalities of gait and mobility: Secondary | ICD-10-CM | POA: Diagnosis not present

## 2022-08-29 DIAGNOSIS — R202 Paresthesia of skin: Secondary | ICD-10-CM | POA: Diagnosis not present

## 2022-09-08 ENCOUNTER — Other Ambulatory Visit
Admission: RE | Admit: 2022-09-08 | Discharge: 2022-09-08 | Disposition: A | Discharge status: Home / Self Care | Payer: Medicare HMO | Source: Ambulatory Visit | Attending: Ophthalmology | Admitting: Ophthalmology

## 2022-09-08 DIAGNOSIS — H35372 Puckering of macula, left eye: Secondary | ICD-10-CM | POA: Diagnosis not present

## 2022-09-08 DIAGNOSIS — G4452 New daily persistent headache (NDPH): Secondary | ICD-10-CM | POA: Diagnosis not present

## 2022-09-08 DIAGNOSIS — H26493 Other secondary cataract, bilateral: Secondary | ICD-10-CM | POA: Diagnosis not present

## 2022-09-08 DIAGNOSIS — H518 Other specified disorders of binocular movement: Secondary | ICD-10-CM | POA: Diagnosis not present

## 2022-09-08 LAB — CBC WITH DIFFERENTIAL/PLATELET
Abs Immature Granulocytes: 0.02 10*3/uL (ref 0.00–0.07)
Basophils Absolute: 0 10*3/uL (ref 0.0–0.1)
Basophils Relative: 0 %
Eosinophils Absolute: 0.1 10*3/uL (ref 0.0–0.5)
Eosinophils Relative: 2 %
HCT: 39.8 % (ref 36.0–46.0)
Hemoglobin: 13.8 g/dL (ref 12.0–15.0)
Immature Granulocytes: 0 %
Lymphocytes Relative: 26 %
Lymphs Abs: 1.3 10*3/uL (ref 0.7–4.0)
MCH: 31.4 pg (ref 26.0–34.0)
MCHC: 34.7 g/dL (ref 30.0–36.0)
MCV: 90.7 fL (ref 80.0–100.0)
Monocytes Absolute: 0.5 10*3/uL (ref 0.1–1.0)
Monocytes Relative: 9 %
Neutro Abs: 3 10*3/uL (ref 1.7–7.7)
Neutrophils Relative %: 63 %
Platelets: 256 10*3/uL (ref 150–400)
RBC: 4.39 MIL/uL (ref 3.87–5.11)
RDW: 13.2 % (ref 11.5–15.5)
WBC: 4.8 10*3/uL (ref 4.0–10.5)
nRBC: 0 % (ref 0.0–0.2)

## 2022-09-08 LAB — C-REACTIVE PROTEIN: CRP: <0.5 mg/dL (ref <1.0)

## 2022-09-08 LAB — SEDIMENTATION RATE: Sed Rate: 4 mm/hr (ref 0–30)

## 2022-09-27 DIAGNOSIS — M65311 Trigger thumb, right thumb: Secondary | ICD-10-CM | POA: Diagnosis not present

## 2022-10-16 DIAGNOSIS — M65311 Trigger thumb, right thumb: Secondary | ICD-10-CM | POA: Diagnosis not present

## 2022-10-16 DIAGNOSIS — M7582 Other shoulder lesions, left shoulder: Secondary | ICD-10-CM | POA: Diagnosis not present

## 2022-11-29 DIAGNOSIS — E782 Mixed hyperlipidemia: Secondary | ICD-10-CM | POA: Diagnosis not present

## 2022-12-14 DIAGNOSIS — C50919 Malignant neoplasm of unspecified site of unspecified female breast: Secondary | ICD-10-CM | POA: Diagnosis not present

## 2022-12-14 DIAGNOSIS — F32A Depression, unspecified: Secondary | ICD-10-CM | POA: Diagnosis not present

## 2022-12-14 DIAGNOSIS — E785 Hyperlipidemia, unspecified: Secondary | ICD-10-CM | POA: Diagnosis not present

## 2022-12-14 DIAGNOSIS — Z23 Encounter for immunization: Secondary | ICD-10-CM | POA: Diagnosis not present

## 2023-03-06 ENCOUNTER — Other Ambulatory Visit: Payer: Self-pay | Admitting: Oncology

## 2023-03-13 ENCOUNTER — Inpatient Hospital Stay: Payer: Medicare HMO | Attending: Oncology | Admitting: Oncology

## 2023-03-13 VITALS — BP 147/89 | HR 68 | Temp 97.7°F | Resp 16 | Ht 61.0 in | Wt 100.1 lb

## 2023-03-13 DIAGNOSIS — Z170 Estrogen receptor positive status [ER+]: Secondary | ICD-10-CM | POA: Insufficient documentation

## 2023-03-13 DIAGNOSIS — Z79811 Long term (current) use of aromatase inhibitors: Secondary | ICD-10-CM | POA: Insufficient documentation

## 2023-03-13 DIAGNOSIS — Z79899 Other long term (current) drug therapy: Secondary | ICD-10-CM | POA: Insufficient documentation

## 2023-03-13 DIAGNOSIS — Z923 Personal history of irradiation: Secondary | ICD-10-CM | POA: Insufficient documentation

## 2023-03-13 DIAGNOSIS — M8580 Other specified disorders of bone density and structure, unspecified site: Secondary | ICD-10-CM | POA: Insufficient documentation

## 2023-03-13 DIAGNOSIS — C50912 Malignant neoplasm of unspecified site of left female breast: Secondary | ICD-10-CM

## 2023-03-13 DIAGNOSIS — I10 Essential (primary) hypertension: Secondary | ICD-10-CM | POA: Diagnosis not present

## 2023-03-13 DIAGNOSIS — D0511 Intraductal carcinoma in situ of right breast: Secondary | ICD-10-CM | POA: Insufficient documentation

## 2023-03-13 MED ORDER — ANASTROZOLE 1 MG PO TABS: 1.0000 mg | ORAL_TABLET | Freq: Every day | ORAL | 3 refills | Status: AC

## 2023-03-13 NOTE — Progress Notes (Signed)
 Needs refill of anastrazole. States that she has not been taking it for months.

## 2023-03-13 NOTE — Progress Notes (Signed)
 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  INTERVAL HISTORY: Patient last evaluated in clinic in December 2023.  She returns to clinic today for further evaluation and reinitiation of anastrozole .  Patient states she ran out of her prescription several months.  She has increased fatigue which she attributes to taking care of her husband and her daughter who has ulcerative colitis.  She otherwise feels well.  She has no neurologic complaints.  She denies any recent fevers or illnesses.  She has a good appetite and denies weight loss.  She has no chest pain, shortness of breath, cough, or hemoptysis.  She denies any nausea, vomiting, constipation, or diarrhea.  She has no urinary complaints.  Patient offers no further specific complaints today.  REVIEW OF SYSTEMS:   Review of Systems  Constitutional:  Positive for malaise/fatigue. Negative for fever.  Respiratory: Negative.  Negative for cough, hemoptysis and shortness of breath.   Cardiovascular: Negative.  Negative for chest pain and leg swelling.  Gastrointestinal: Negative.  Negative for abdominal pain.  Genitourinary: Negative.  Negative for dysuria.  Musculoskeletal: Negative.  Negative for back pain and joint pain.  Skin: Negative.  Negative for rash.  Neurological: Negative.  Negative for dizziness, sensory change, focal weakness, weakness and headaches.  Psychiatric/Behavioral: Negative.  The patient is  not nervous/anxious.     As per HPI. Otherwise, a complete review of systems is negative.  PAST MEDICAL HISTORY: Past Medical History:  Diagnosis Date   Breast cancer (HCC) 01/2019   right breast ca DCIS   COVID-19 09/2018   GERD (gastroesophageal reflux disease)    Hyperlipidemia    Hypertension    Hypothyroidism    Lumbar disc disease    MVP (mitral valve prolapse)    Personal history of radiation therapy    right breast ca. Finished 3/021   Pneumonia 09/2018   pt states she was positive for covid then as well    PAST SURGICAL HISTORY: Past Surgical History:  Procedure Laterality Date   ABDOMINAL HYSTERECTOMY     BREAST BIOPSY Right 01/08/2019   Affirm Biopsy- X-Clip- DCIS   BREAST BIOPSY Left 01/06/2021   stereo bx-asymmetry-coil clip-[facility]   BREAST LUMPECTOMY Right 02/13/2019   DCIS, negative LN,  close margins of 1 mm lateral   CARDIAC CATHETERIZATION N/A 06/02/2015   Procedure: Left Heart Cath and Coronary Angiography;  Surgeon: Vinie DELENA Jude, MD;  Location: [facility]IVE [facility];  Service: Cardiovascular;  Laterality: N/A;   COLONOSCOPY WITH PROPOFOL  N/A 01/27/2019   Procedure: COLONOSCOPY WITH PROPOFOL ;  Surgeon: Toledo, Ladell MARLA, MD;  Location: [facility] ENDOSCOPY;  Service: Gastroenterology;  Laterality: N/A;   ESOPHAGOGASTRODUODENOSCOPY (EGD) WITH PROPOFOL  N/A 01/27/2019   Procedure: ESOPHAGOGASTRODUODENOSCOPY (EGD) WITH PROPOFOL ;  Surgeon: Toledo, Ladell MARLA, MD;  Location: [facility] ENDOSCOPY;  Service: Gastroenterology;  Laterality: N/A;   PARTIAL MASTECTOMY WITH AXILLARY SENTINEL LYMPH NODE BIOPSY Right 02/13/2019   Procedure: PARTIAL MASTECTOMY WITH AXILLARY SENTINEL LYMPH NODE BIOPSY;  Surgeon: Tye Millet, DO;  Location: [facility] ORS;  Service: General;  Laterality:  Right;   PARTIAL MASTECTOMY WITH AXILLARY SENTINEL LYMPH NODE BIOPSY Left 02/02/2021   Procedure: PARTIAL MASTECTOMY WITH AXILLARY SENTINEL LYMPH NODE BIOPSY & RF guided;  Surgeon: Tye Millet, DO;   Location: [facility] ORS;  Service: General;  Laterality: Left;   STOMACH SURGERY     growth removed also removed gallbladder   THYROID  SURGERY      FAMILY HISTORY: Family History  Problem Relation Age of Onset   Breast cancer Mother 68   Breast cancer Maternal Aunt    Breast cancer Maternal Aunt    Stomach cancer Other    Throat cancer Other     ADVANCED DIRECTIVES (Y/N):  N  HEALTH MAINTENANCE: Social History   Tobacco Use   Smoking status: Never   Smokeless tobacco: Never  Vaping Use   Vaping status: Never Used  Substance Use Topics   Alcohol use: Not Currently   Drug use: No     Colonoscopy:  PAP:  Bone density:  Lipid panel:  Allergies  Allergen Reactions   Ambien [Zolpidem] Other (See Comments)    erratic behavior/made me crazy   Latex Itching    redness   Paxil [Paroxetine] Other (See Comments)    Stomach pains.   Penicillins Other (See Comments)    Blisters in nose Did it involve swelling of the face/tongue/throat, SOB, or low BP? No Did it involve sudden or severe rash/hives, skin peeling, or any reaction on the inside of your mouth or nose? Yes--blisters in nose Did you need to seek medical attention at a hospital or doctor's office? No When did it last happen? More than 10 years ago    If all above answers are "NO", may proceed with cephalosporin use.     Current Outpatient Medications  Medication Sig Dispense Refill   ALPRAZolam (XANAX) 0.25 MG tablet Take 0.25 mg by mouth daily as needed for anxiety.     amLODipine (NORVASC) 5 MG tablet Take 5 mg by mouth every morning.  3   ibuprofen  (ADVIL ) 400 MG tablet Take 1 tablet (400 mg total) by mouth every 8 (eight) hours as needed for mild pain or moderate pain. 30 tablet 0   levothyroxine (SYNTHROID) 75 MCG tablet Take 75 mcg by mouth daily.     meclizine (ANTIVERT) 25 MG tablet Take by mouth.     meloxicam (MOBIC) 7.5 MG tablet Take 7.5 mg by mouth daily.     omeprazole  (PRILOSEC) 10 MG capsule Take  1 capsule (10 mg total) by mouth daily. 30 capsule 1   solifenacin (VESICARE) 5 MG tablet Take 5 mg by mouth daily.     spironolactone (ALDACTONE) 25 MG tablet Take 25 mg by mouth daily.     venlafaxine XR (EFFEXOR-XR) 37.5 MG 24 hr capsule Take 37.5 mg by mouth daily.     anastrozole  (ARIMIDEX ) 1 MG tablet Take 1 tablet (1 mg total) by mouth daily. 90 tablet 3   Biotin 1 MG CAPS Take 1 mg by mouth daily. (Patient not taking: Reported on 03/13/2023)     Cholecalciferol 50 MCG (2000 UT) TABS Take 2,000 Units by mouth daily. (Patient not taking: Reported on 03/13/2023)     gabapentin  (NEURONTIN ) 100 MG capsule Take 100 mg by mouth at bedtime. (Patient not taking: Reported on 03/13/2023)     sucralfate (CARAFATE) 1 g tablet Take 1 g by mouth 4 (four) times daily -  with meals and at bedtime. (Patient not taking: Reported on 03/13/2023)     No current  facility-administered medications for this visit.    OBJECTIVE: Vitals:   03/13/23 0956  BP: (!) 147/89  Pulse: 68  Resp: 16  Temp: 97.7 F (36.5 C)  SpO2: 100%     Body mass index is 18.91 kg/m.    ECOG FS:0 - Asymptomatic  General: Well-developed, well-nourished, no acute distress. Eyes: Pink conjunctiva, anicteric sclera. HEENT: Normocephalic, moist mucous membranes. Lungs: No audible wheezing or coughing. Heart: Regular rate and rhythm. Abdomen: Soft, nontender, no obvious distention. Musculoskeletal: No edema, cyanosis, or clubbing. Neuro: Alert, answering all questions appropriately. Cranial nerves grossly intact. Skin: No rashes or petechiae noted. Psych: Normal affect.  LAB RESULTS:  Lab Results  Component Value Date   NA 138 08/17/2022   K 3.8 08/17/2022   CL 104 08/17/2022   CO2 26 08/17/2022   GLUCOSE 93 08/17/2022   BUN 19 08/17/2022   CREATININE 0.92 08/17/2022   CALCIUM 9.3 08/17/2022   PROT 6.4 (L) 08/17/2022   ALBUMIN 4.2 08/17/2022   AST 20 08/17/2022   ALT 11 08/17/2022   ALKPHOS 62 08/17/2022   BILITOT 0.8  08/17/2022   GFRNONAA >60 08/17/2022   GFRAA >60 10/05/2018    Lab Results  Component Value Date   WBC 4.8 09/08/2022   NEUTROABS 3.0 09/08/2022   HGB 13.8 09/08/2022   HCT 39.8 09/08/2022   MCV 90.7 09/08/2022   PLT 256 09/08/2022     STUDIES: No results found.  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  Patient underwent lumpectomy on February 02, 2021 confirming stage of disease.  Given the fact that the tumor size was a T1a, she did not require Oncotype testing or chemotherapy.  Patient completed adjuvant XRT on April 12, 2021.  Patient was reinitiated on anastrozole  today and recommend a total of 5 years of treatment completing in approximately February 2029.  Patient will require a repeat mammogram in the next 1 to 2 weeks.  Return to clinic in 6 months for routine evaluation.   DCIS, right breast: Patient underwent lumpectomy on February 13, 2019.  Although patient had close margins of 1 mm, it was determined that reexcision is not necessary.  She proceeded directly with adjuvant XRT completing in March 2021.  Anastrozole  as above. Hypertension: Chronic and unchanged.  Patient's blood pressure is moderately elevated today.  Continue treatment and monitoring per primary care. Osteopenia: Bone mineral density on May 09, 2021 revealed a T score of -1.9.  Repeat bone mineral density along with mammogram as above.  I spent a total of 30 minutes reviewing chart data, face-to-face evaluation with the patient, counseling and coordination of care as detailed above.  Patient expressed understanding and was in agreement with this plan. She also understands that She can call clinic at any time  with any questions, concerns, or complaints.    Cancer Staging  Ductal carcinoma in situ (DCIS) of right breast Staging form: Breast, AJCC 8th Edition - Clinical stage from 01/16/2019: Stage 0 (cTis (DCIS), cN0, cM0, ER+, PR+, HER2-) - Signed by Jacobo Evalene PARAS, MD on 01/16/2019 Stage prefix: Initial diagnosis  Invasive ductal carcinoma of left breast [facility]) Staging form: Breast, AJCC 8th Edition - Pathologic stage from 02/10/2021: Stage IA (pT1a, pN0, cM0,  G1, ER+, PR+, HER2-) - Signed by Jacobo Evalene PARAS, MD on 02/10/2021 Stage prefix: Initial diagnosis Histologic grading system: 3 grade system   Evalene PARAS Jacobo, MD   03/13/2023 11:25 AM

## 2023-03-14 ENCOUNTER — Other Ambulatory Visit: Payer: Self-pay | Admitting: Oncology

## 2023-03-14 DIAGNOSIS — C50912 Malignant neoplasm of unspecified site of left female breast: Secondary | ICD-10-CM

## 2023-03-28 ENCOUNTER — Ambulatory Visit
Admission: RE | Admit: 2023-03-28 | Discharge: 2023-03-28 | Disposition: A | Discharge status: Home / Self Care | Payer: Medicare HMO | Source: Ambulatory Visit | Attending: Oncology | Admitting: Oncology

## 2023-03-28 ENCOUNTER — Other Ambulatory Visit: Payer: Self-pay | Admitting: *Deleted

## 2023-03-28 ENCOUNTER — Telehealth: Payer: Self-pay | Admitting: *Deleted

## 2023-03-28 DIAGNOSIS — Z923 Personal history of irradiation: Secondary | ICD-10-CM | POA: Insufficient documentation

## 2023-03-28 DIAGNOSIS — D0512 Intraductal carcinoma in situ of left breast: Secondary | ICD-10-CM | POA: Diagnosis not present

## 2023-03-28 DIAGNOSIS — C50912 Malignant neoplasm of unspecified site of left female breast: Secondary | ICD-10-CM

## 2023-03-28 DIAGNOSIS — Z79811 Long term (current) use of aromatase inhibitors: Secondary | ICD-10-CM | POA: Diagnosis not present

## 2023-03-28 DIAGNOSIS — C50312 Malignant neoplasm of lower-inner quadrant of left female breast: Secondary | ICD-10-CM | POA: Insufficient documentation

## 2023-03-28 DIAGNOSIS — M810 Age-related osteoporosis without current pathological fracture: Secondary | ICD-10-CM | POA: Diagnosis not present

## 2023-03-28 DIAGNOSIS — N644 Mastodynia: Secondary | ICD-10-CM | POA: Insufficient documentation

## 2023-03-28 DIAGNOSIS — R92333 Mammographic heterogeneous density, bilateral breasts: Secondary | ICD-10-CM | POA: Diagnosis not present

## 2023-03-28 MED ORDER — ALENDRONATE SODIUM 70 MG PO TABS: 70.0000 mg | ORAL_TABLET | ORAL | 3 refills | Status: AC

## 2023-03-28 NOTE — Telephone Encounter (Signed)
Message received from MD regarding results of bone density scan. MD recommends patient take Calcium and Vitamin D supplement as well as add in Fosamax. Patient is not currently taking Calcium or Vitamin D, RN advised dosage and that medication is over the counter. Patient is agreeable to beginning fosamax, prescription sent to patients pharmacy. Encouraged patient to call with any questions or concerns.

## 2023-05-17 ENCOUNTER — Ambulatory Visit: Payer: Medicare HMO | Admitting: Radiation Oncology

## 2023-06-07 DIAGNOSIS — R7989 Other specified abnormal findings of blood chemistry: Secondary | ICD-10-CM | POA: Diagnosis not present

## 2023-06-07 DIAGNOSIS — E782 Mixed hyperlipidemia: Secondary | ICD-10-CM | POA: Diagnosis not present

## 2023-06-07 DIAGNOSIS — R739 Hyperglycemia, unspecified: Secondary | ICD-10-CM | POA: Diagnosis not present

## 2023-06-14 DIAGNOSIS — F32A Depression, unspecified: Secondary | ICD-10-CM | POA: Diagnosis not present

## 2023-06-14 DIAGNOSIS — Z0000 Encounter for general adult medical examination without abnormal findings: Secondary | ICD-10-CM | POA: Diagnosis not present

## 2023-06-14 DIAGNOSIS — C50919 Malignant neoplasm of unspecified site of unspecified female breast: Secondary | ICD-10-CM | POA: Diagnosis not present

## 2023-06-14 DIAGNOSIS — Z1331 Encounter for screening for depression: Secondary | ICD-10-CM | POA: Diagnosis not present

## 2023-07-24 ENCOUNTER — Other Ambulatory Visit: Payer: Self-pay

## 2023-07-24 ENCOUNTER — Encounter: Payer: Self-pay | Admitting: Emergency Medicine

## 2023-07-24 ENCOUNTER — Emergency Department
Admission: EM | Admit: 2023-07-24 | Discharge: 2023-07-24 | Disposition: A | Discharge status: Home / Self Care | Attending: Emergency Medicine | Admitting: Emergency Medicine

## 2023-07-24 DIAGNOSIS — W19XXXA Unspecified fall, initial encounter: Secondary | ICD-10-CM

## 2023-07-24 DIAGNOSIS — S0101XA Laceration without foreign body of scalp, initial encounter: Secondary | ICD-10-CM | POA: Insufficient documentation

## 2023-07-24 DIAGNOSIS — W06XXXA Fall from bed, initial encounter: Secondary | ICD-10-CM | POA: Insufficient documentation

## 2023-07-24 DIAGNOSIS — Z23 Encounter for immunization: Secondary | ICD-10-CM | POA: Insufficient documentation

## 2023-07-24 DIAGNOSIS — E039 Hypothyroidism, unspecified: Secondary | ICD-10-CM | POA: Diagnosis not present

## 2023-07-24 DIAGNOSIS — Y92003 Bedroom of unspecified non-institutional (private) residence as the place of occurrence of the external cause: Secondary | ICD-10-CM | POA: Insufficient documentation

## 2023-07-24 MED ORDER — ACETAMINOPHEN 500 MG PO TABS
1000.0000 mg | ORAL_TABLET | Freq: Once | ORAL | Status: AC
  Administered 2023-07-24: 1000 mg via ORAL
  Filled 2023-07-24: qty 2

## 2023-07-24 MED ORDER — TETANUS-DIPHTH-ACELL PERTUSSIS 5-2.5-18.5 LF-MCG/0.5 IM SUSY
0.5000 mL | PREFILLED_SYRINGE | Freq: Once | INTRAMUSCULAR | Status: AC
  Administered 2023-07-24: 0.5 mL via INTRAMUSCULAR
  Filled 2023-07-24: qty 0.5

## 2023-07-24 NOTE — ED Provider Notes (Signed)
 [facility] Provider Note   Event Date/Time   First MD Initiated Contact with Patient 07/24/23 0016     (approximate) History  No chief complaint on file.  HPI Chelsea Peters is a 83 y.o. female with a past medical history of GERD, hypothyroidism, hyperlipidemia, and retention who presents after she rolled out of bed hitting the back of her head on the nightstand.  Patient denies any loss of consciousness.  Patient denies any nausea/vomiting.  Patient denies any subsequent drowsiness or loss of consciousness.  Patient does have a small abrasion/laceration to the occiput that is hemostatic ROS: Patient currently denies any vision changes, tinnitus, difficulty speaking, facial droop, sore throat, chest pain, shortness of breath, abdominal pain, nausea/vomiting/diarrhea, dysuria, or weakness/numbness/paresthesias in any extremity   Physical Exam  Triage Vital Signs: ED Triage Vitals  Encounter Vitals Group     BP 07/24/23 0027 (!) 183/95     Systolic BP Percentile --      Diastolic BP Percentile --      Pulse Rate 07/24/23 0017 78     Resp 07/24/23 0017 18     Temp 07/24/23 0017 98.1 F (36.7 C)     Temp Source 07/24/23 0017 Oral     SpO2 07/24/23 0017 100 %     Weight 07/24/23 0014 98 lb (44.5 kg)     Height 07/24/23 0014 5\' 1"  (1.549 m)     Head Circumference --      Peak Flow --      Pain Score 07/24/23 0014 5     Pain Loc --      Pain Education --      Exclude from Growth Chart --    Most recent vital signs: Vitals:   07/24/23 0017 07/24/23 0027  BP:  (!) 183/95  Pulse: 78   Resp: 18   Temp: 98.1 F (36.7 C)   SpO2: 100%    General: Awake, oriented x4. CV:  Good peripheral perfusion.  Resp:  Normal effort.  Abd:  No distention.  Other:  Elderly well-developed, well-nourished Caucasian female resting comfortably in no acute distress.  Hematoma with underlying curvilinear superficial laceration and abrasion to the occipital scalp ED Results  / Procedures / Treatments  Labs (all labs ordered are listed, but only abnormal results are displayed) Labs Reviewed - No data to display PROCEDURES: Critical Care performed: No Procedures MEDICATIONS ORDERED IN ED: Medications  acetaminophen  (TYLENOL ) tablet 1,000 mg (1,000 mg Oral Given 07/24/23 0035)  Tdap (BOOSTRIX) injection 0.5 mL (0.5 mLs Intramuscular Given 07/24/23 0056)   IMPRESSION / MDM / ASSESSMENT AND PLAN / ED COURSE  I reviewed the triage vital signs and the nursing notes.                             Presenting after a fall that occurred just prior to arrival, resulting in injury to the occiput. The mechanism of injury was a mechanical ground level fall without syncope or near-syncope. The current level of pain is moderate. There was no loss of consciousness, confusion, seizure, or memory impairment. There is a superficial laceration associated with the injury.  After cleaning wound, no repair necessary Denies neck pain. The patient does not take blood thinner medications. Denies vomiting, numbness/weakness, fever  Dispo: Discharge with PCP follow-up       FINAL CLINICAL IMPRESSION(S) / ED DIAGNOSES   Final diagnoses:  Fall, initial encounter  Laceration  of scalp, initial encounter   Rx / DC Orders   ED Discharge Orders     None      Note:  This document was prepared using Dragon voice recognition software and may include unintentional dictation errors.   Printice Hellmer K, MD 07/24/23 [phone]

## 2023-07-24 NOTE — ED Triage Notes (Signed)
 Pt to triage via w/c with no distress noted; pt reports that she rolled OOB hitting her head on the nightstand; denies LOC; small lac/hematoma to occipitut

## 2023-08-03 DIAGNOSIS — S0990XD Unspecified injury of head, subsequent encounter: Secondary | ICD-10-CM | POA: Diagnosis not present

## 2023-08-03 DIAGNOSIS — M545 Low back pain, unspecified: Secondary | ICD-10-CM | POA: Diagnosis not present

## 2023-08-03 DIAGNOSIS — W010XXA Fall on same level from slipping, tripping and stumbling without subsequent striking against object, initial encounter: Secondary | ICD-10-CM | POA: Diagnosis not present

## 2023-09-10 ENCOUNTER — Encounter: Payer: Self-pay | Admitting: Oncology

## 2023-09-10 ENCOUNTER — Inpatient Hospital Stay: Payer: Medicare HMO | Attending: Oncology | Admitting: Oncology

## 2023-09-10 DIAGNOSIS — M3501 Sicca syndrome with keratoconjunctivitis: Secondary | ICD-10-CM | POA: Diagnosis not present

## 2023-09-10 DIAGNOSIS — H518 Other specified disorders of binocular movement: Secondary | ICD-10-CM | POA: Diagnosis not present

## 2023-09-10 DIAGNOSIS — H43813 Vitreous degeneration, bilateral: Secondary | ICD-10-CM | POA: Diagnosis not present

## 2023-12-07 DIAGNOSIS — E782 Mixed hyperlipidemia: Secondary | ICD-10-CM | POA: Diagnosis not present

## 2023-12-14 DIAGNOSIS — Z79899 Other long term (current) drug therapy: Secondary | ICD-10-CM | POA: Diagnosis not present

## 2023-12-14 DIAGNOSIS — F32A Depression, unspecified: Secondary | ICD-10-CM | POA: Diagnosis not present

## 2023-12-14 DIAGNOSIS — M810 Age-related osteoporosis without current pathological fracture: Secondary | ICD-10-CM | POA: Diagnosis not present

## 2023-12-14 DIAGNOSIS — R0609 Other forms of dyspnea: Secondary | ICD-10-CM | POA: Diagnosis not present

## 2023-12-14 DIAGNOSIS — C50919 Malignant neoplasm of unspecified site of unspecified female breast: Secondary | ICD-10-CM | POA: Diagnosis not present

## 2024-01-28 DIAGNOSIS — M9904 Segmental and somatic dysfunction of sacral region: Secondary | ICD-10-CM | POA: Diagnosis not present

## 2024-01-28 DIAGNOSIS — M9905 Segmental and somatic dysfunction of pelvic region: Secondary | ICD-10-CM | POA: Diagnosis not present

## 2024-01-28 DIAGNOSIS — M9903 Segmental and somatic dysfunction of lumbar region: Secondary | ICD-10-CM | POA: Diagnosis not present

## 2024-01-28 DIAGNOSIS — M9901 Segmental and somatic dysfunction of cervical region: Secondary | ICD-10-CM | POA: Diagnosis not present

## 2024-02-01 DIAGNOSIS — M9901 Segmental and somatic dysfunction of cervical region: Secondary | ICD-10-CM | POA: Diagnosis not present

## 2024-02-01 DIAGNOSIS — M9903 Segmental and somatic dysfunction of lumbar region: Secondary | ICD-10-CM | POA: Diagnosis not present

## 2024-02-01 DIAGNOSIS — M9904 Segmental and somatic dysfunction of sacral region: Secondary | ICD-10-CM | POA: Diagnosis not present

## 2024-02-01 DIAGNOSIS — M9905 Segmental and somatic dysfunction of pelvic region: Secondary | ICD-10-CM | POA: Diagnosis not present

## 2024-02-04 DIAGNOSIS — M9905 Segmental and somatic dysfunction of pelvic region: Secondary | ICD-10-CM | POA: Diagnosis not present

## 2024-02-04 DIAGNOSIS — M9904 Segmental and somatic dysfunction of sacral region: Secondary | ICD-10-CM | POA: Diagnosis not present

## 2024-02-04 DIAGNOSIS — M9901 Segmental and somatic dysfunction of cervical region: Secondary | ICD-10-CM | POA: Diagnosis not present

## 2024-02-04 DIAGNOSIS — M9903 Segmental and somatic dysfunction of lumbar region: Secondary | ICD-10-CM | POA: Diagnosis not present

## 2024-03-05 ENCOUNTER — Encounter: Payer: Self-pay | Admitting: Internal Medicine

## 2024-03-05 DIAGNOSIS — Z1231 Encounter for screening mammogram for malignant neoplasm of breast: Secondary | ICD-10-CM

## 2024-03-07 ENCOUNTER — Encounter: Payer: Self-pay | Admitting: Internal Medicine

## 2024-03-12 ENCOUNTER — Other Ambulatory Visit: Payer: Self-pay | Admitting: Internal Medicine

## 2024-03-12 DIAGNOSIS — N644 Mastodynia: Secondary | ICD-10-CM

## 2024-03-12 DIAGNOSIS — Z1231 Encounter for screening mammogram for malignant neoplasm of breast: Secondary | ICD-10-CM

## 2024-03-19 ENCOUNTER — Ambulatory Visit
Admission: RE | Admit: 2024-03-19 | Discharge: 2024-03-19 | Disposition: A | Discharge status: Home / Self Care | Source: Ambulatory Visit | Attending: Internal Medicine | Admitting: Internal Medicine

## 2024-03-19 ENCOUNTER — Inpatient Hospital Stay
Admission: RE | Admit: 2024-03-19 | Discharge: 2024-03-19 | Disposition: A | Source: Ambulatory Visit | Attending: Internal Medicine | Admitting: Internal Medicine

## 2024-03-19 DIAGNOSIS — N644 Mastodynia: Secondary | ICD-10-CM

## 2024-03-19 DIAGNOSIS — Z1231 Encounter for screening mammogram for malignant neoplasm of breast: Secondary | ICD-10-CM | POA: Diagnosis present
